# Patient Record
Sex: Male | Born: 1975
Health system: Southern US, Community
[De-identification: ages and names within clinical notes are randomized; demographics above are authoritative.]

## PROBLEM LIST (undated history)

## (undated) DIAGNOSIS — F32A Depression, unspecified: Secondary | ICD-10-CM

## (undated) DIAGNOSIS — F329 Major depressive disorder, single episode, unspecified: Secondary | ICD-10-CM

## (undated) DIAGNOSIS — F419 Anxiety disorder, unspecified: Secondary | ICD-10-CM

---
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
## 2001-07-08 ENCOUNTER — Encounter: Admission: RE | Admit: 2001-07-08 | Discharge: 2001-07-08 | Payer: Self-pay | Admitting: Family Medicine

## 2001-07-08 ENCOUNTER — Encounter: Payer: Self-pay | Admitting: Family Medicine

## 2004-11-26 ENCOUNTER — Ambulatory Visit (HOSPITAL_COMMUNITY): Admission: RE | Admit: 2004-11-26 | Discharge: 2004-11-26 | Payer: Self-pay | Admitting: Allergy

## 2005-07-21 ENCOUNTER — Encounter: Admission: RE | Admit: 2005-07-21 | Discharge: 2005-07-21 | Payer: Self-pay | Admitting: Family Medicine

## 2005-07-23 ENCOUNTER — Encounter: Admission: RE | Admit: 2005-07-23 | Discharge: 2005-07-23 | Payer: Self-pay | Admitting: Family Medicine

## 2005-08-13 ENCOUNTER — Ambulatory Visit (HOSPITAL_COMMUNITY): Admission: RE | Admit: 2005-08-13 | Discharge: 2005-08-13 | Payer: Self-pay | Admitting: Neurosurgery

## 2005-12-15 ENCOUNTER — Encounter: Admission: RE | Admit: 2005-12-15 | Discharge: 2005-12-15 | Payer: Self-pay | Admitting: Neurosurgery

## 2006-04-30 ENCOUNTER — Observation Stay (HOSPITAL_COMMUNITY): Admission: RE | Admit: 2006-04-30 | Discharge: 2006-04-30 | Payer: Self-pay | Admitting: Neurosurgery

## 2008-02-23 ENCOUNTER — Encounter: Admission: RE | Admit: 2008-02-23 | Discharge: 2008-02-23 | Payer: Self-pay | Admitting: Family Medicine

## 2008-07-31 ENCOUNTER — Encounter: Admission: RE | Admit: 2008-07-31 | Discharge: 2008-07-31 | Payer: Self-pay | Admitting: Neurosurgery

## 2008-11-08 ENCOUNTER — Ambulatory Visit (HOSPITAL_COMMUNITY): Admission: RE | Admit: 2008-11-08 | Discharge: 2008-11-08 | Payer: Self-pay | Admitting: Neurosurgery

## 2009-01-03 ENCOUNTER — Encounter: Admission: RE | Admit: 2009-01-03 | Discharge: 2009-01-03 | Payer: Self-pay | Admitting: Neurosurgery

## 2009-03-06 ENCOUNTER — Encounter: Admission: RE | Admit: 2009-03-06 | Discharge: 2009-03-06 | Payer: Self-pay | Admitting: Family Medicine

## 2009-07-10 ENCOUNTER — Encounter: Admission: RE | Admit: 2009-07-10 | Discharge: 2009-07-10 | Payer: Self-pay | Admitting: Neurosurgery

## 2010-10-24 ENCOUNTER — Ambulatory Visit (HOSPITAL_COMMUNITY)
Admission: RE | Admit: 2010-10-24 | Discharge: 2010-10-24 | Payer: Self-pay | Source: Home / Self Care | Attending: Neurosurgery | Admitting: Neurosurgery

## 2010-12-02 ENCOUNTER — Encounter
Admission: RE | Admit: 2010-12-02 | Discharge: 2010-12-02 | Payer: Self-pay | Source: Home / Self Care | Attending: Neurosurgery | Admitting: Neurosurgery

## 2010-12-05 ENCOUNTER — Inpatient Hospital Stay (HOSPITAL_COMMUNITY)
Admission: RE | Admit: 2010-12-05 | Discharge: 2010-12-08 | DRG: 030 | Disposition: A | Discharge status: Home / Self Care | Payer: 59 | Source: Ambulatory Visit | Attending: Neurosurgery | Admitting: Neurosurgery

## 2010-12-05 DIAGNOSIS — G988 Other disorders of nervous system: Principal | ICD-10-CM | POA: Diagnosis present

## 2010-12-05 DIAGNOSIS — F17200 Nicotine dependence, unspecified, uncomplicated: Secondary | ICD-10-CM | POA: Diagnosis present

## 2010-12-05 DIAGNOSIS — Y849 Medical procedure, unspecified as the cause of abnormal reaction of the patient, or of later complication, without mention of misadventure at the time of the procedure: Secondary | ICD-10-CM | POA: Diagnosis present

## 2010-12-05 DIAGNOSIS — K5900 Constipation, unspecified: Secondary | ICD-10-CM | POA: Diagnosis present

## 2010-12-05 LAB — SURGICAL PCR SCREEN
MRSA, PCR: NEGATIVE
Staphylococcus aureus: NEGATIVE

## 2010-12-05 LAB — CBC
Hemoglobin: 13.7 g/dL (ref 13.0–17.0)
Platelets: 291 10*3/uL (ref 150–400)
RBC: 4.35 MIL/uL (ref 4.22–5.81)
WBC: 11.4 10*3/uL — ABNORMAL HIGH (ref 4.0–10.5)

## 2010-12-14 NOTE — Op Note (Signed)
Peter Valdez, Peter Valdez               ACCOUNT NO.:  [id number]  MEDICAL RECORD NO.:  [id number]           PATIENT TYPE:  I  LOCATION:  3006                         FACILITY:  [facility]  PHYSICIAN:  Danae Orleans. Venetia Maxon, M.D.  DATE OF BIRTH:  1976-09-26  DATE OF PROCEDURE:  12/05/2010 DATE OF DISCHARGE:                              OPERATIVE REPORT   PREOPERATIVE DIAGNOSIS:  Lumbar fluid collection with leg pain.  POSTOPERATIVE DIAGNOSIS:  Lumbar fluid collection with leg pain.  PROCEDURE:  Exploration of lumbar wound with repair of CSF leak.  SURGEON:  Danae Orleans. Venetia Maxon, MD  ASSISTANT:  Hewitt Shorts, MD  ANESTHESIA:  General endotracheal anesthesia.  ESTIMATED BLOOD LOSS:  Minimal.  COMPLICATIONS:  None.  DISPOSITION:  Recovery.  INDICATIONS:  Peter Valdez is a 35 year old man who had a right L4-5 microdiskectomy.  He did well following surgery for a month.  He said he was getting into his truck and developed back pain and leg pain and since then has had intermittent headache.  He had some fluid behind his lumbar wound, which developed.  He was not complaining of significant headache.  It was elected to take him to surgery for exploration of lumbar wound, which was felt to be consistent with CSF leak.  PROCEDURE:  [name] was brought to the operating room.  Following a satisfactory and uncomplicated induction of general endotracheal anesthesia and placement of intravenous lines, the patient was placed in a prone position on the Wilson frame.  His low back was prepped and draped in usual sterile fashion.  Previous incision was reopened immediately there was some CSF confirming suspicion of CSF leak. Exposure of previous laminectomy defect was then performed.  The previous diskectomy site was inspected and residual disk material was removed.  There was no immediately apparent site of CSF leakage, but on exploration of the dura, it was found that at the site of  the foraminotomy overlying the L5 nerve root, a spicule of bone had punctured the dura and that a nerve root had herniated through this dural opening.  To repair this it was felt that we need to remove more bone, so a more thorough foraminotomy was performed overlying the L5 nerve root to decompress the dura.  The edges of dura were defined under microscopic visualization and using microdissection technique the dural opening was closed after returning the nerve roots to the thecal sac and it was closed with three 6-0 Prolene interrupted sutures.  There did not appear to be any residual leakage of CSF after this and Valsalva maneuver did not demonstrate any additional leakage of spinal fluid. DuraSeal was placed over the repaired dural defect.  The fascia was then closed with 0- Vicryl sutures, subcutaneous tissues were reapproximated with 2-0 Vicryl interrupted inverted sutures, and skin edges were approximated with 3-0 nylon running lock stitch.  Sterile occlusive dressing was placed.  A Foley catheter was placed at the conclusion of surgery. Danae Orleans. Venetia Maxon, M.D.     JDS/MEDQ  D:  12/05/2010  T:  12/06/2010  Job:  [id number]  Electronically Signed by Maeola Harman M.D. on 12/12/2010  10:19:43 AM

## 2011-01-12 LAB — SURGICAL PCR SCREEN
MRSA, PCR: NEGATIVE
Staphylococcus aureus: POSITIVE — AB

## 2011-01-12 LAB — CBC
Hemoglobin: 14 g/dL (ref 13.0–17.0)
Platelets: 278 10*3/uL (ref 150–400)
RBC: 4.34 MIL/uL (ref 4.22–5.81)
WBC: 11.2 10*3/uL — ABNORMAL HIGH (ref 4.0–10.5)

## 2011-02-16 LAB — CBC
HCT: 42.6 % (ref 39.0–52.0)
Hemoglobin: 14.8 g/dL (ref 13.0–17.0)
MCHC: 34.6 g/dL (ref 30.0–36.0)
MCV: 94.2 fL (ref 78.0–100.0)
RBC: 4.52 MIL/uL (ref 4.22–5.81)
WBC: 11.2 10*3/uL — ABNORMAL HIGH (ref 4.0–10.5)

## 2011-02-24 NOTE — Discharge Summary (Signed)
  [name], [name]               ACCOUNT NO.:  [id number]  MEDICAL RECORD NO.:  [id number]           PATIENT TYPE:  I  LOCATION:  3006                         FACILITY:  [facility]  PHYSICIAN:  Danae Orleans. Venetia Maxon, M.D.  DATE OF BIRTH:  16-Apr-1976  DATE OF ADMISSION:  12/05/2010 DATE OF DISCHARGE:  12/08/2010                              DISCHARGE SUMMARY   REASON FOR ADMISSION:  Lumbar fluid collection with leg pain.  HISTORY OF ILLNESS:  Peter Valdez is a 35 year old man who had undergone a right L4-L5 microdiskectomy.  He did well following surgery for 1 month, then he said he was getting into his truck, developed back pain and leg pain, and since then he has had intermittent headache.  He had some fluid behind his wound which had developed.  He was not complaining of significant headache.  It was elected to take him for surgery for exploration of his lumbar wound which was felt to be consistent with CSF leakage.  At the time of surgery, the patient was found to have in detail CSF leak that on exploration of the dura was found at the site of the foraminotomy overlying the L5 nerve root. A spicule of bone had puncture the dura and that the nerve root had herniated through this dural opening.  This was then repaired with 6-0 Prolene stitches and there did not appear to be any residual leakage of spinal fluid at this point.  The patient was then admitted to the hospital and was maintained on bedrest for 3 days postoperatively, was then mobilized, and discharged home with instructions to follow up in the office for suture removal 10 days postoperatively.  His preoperative medicines include: 1. Tizanidine 4 mg twice daily as needed. 2. Percocet 10/325 one every 6 hours as needed. 3. Zyrtec 10 mg daily. 4. Amitriptyline 75 mg daily at bedtime. 5. Ibuprofen 200 mg every 8 hours as needed.  DISCHARGE STATUS:  Good.  FINAL DIAGNOSIS:  Same as admission diagnosis.  DISCHARGE CONDITION:   Improved.  FOLLOWUP:  Follow up in 10 days for suture removal.     Danae Orleans. Venetia Maxon, M.D.     JDS/MEDQ  D:  02/18/2011  T:  02/18/2011  Job:  [id number]  Electronically Signed by Maeola Harman M.D. on 02/24/2011 07:37:06 AM

## 2011-03-17 NOTE — Op Note (Signed)
NAMEWYMAN, MESCHKE               ACCOUNT NO.:  [id number]   MEDICAL RECORD NO.:  [id number]          PATIENT TYPE:  OIB   LOCATION:  3524                         FACILITY:  [facility]   PHYSICIAN:  Danae Orleans. Venetia Maxon, M.D.  DATE OF BIRTH:  Mar 02, 1976   DATE OF PROCEDURE:  11/08/2008  DATE OF DISCHARGE:  11/08/2008                               OPERATIVE REPORT   PREOPERATIVE DIAGNOSIS:  Left ulnar neuropathy.   POSTOPERATIVE DIAGNOSIS:  Left ulnar neuropathy.   PROCEDURE:  Left ulnar nerve decompression.   SURGEON:  Danae Orleans. Venetia Maxon, MD   ANESTHESIA:  General laryngeal mask anesthesia.   ESTIMATED BLOOD LOSS:  Minimal.   COMPLICATIONS:  None.   DISPOSITION:  Recovery.   INDICATIONS:  Peter Valdez is a 35 year old man with left ulnar nerve  neuropathy with weakness in his left hand documented by EMG nerve  conduction velocities, elected to perform a left ulnar nerve  decompression.   PROCEDURE:  [name] was brought to the operating room.  Following  satisfactory uncomplicated induction of general anesthesia with  laryngeal mask anesthesia, the patient was placed in supine position on  the operating table.  His left arm was then prepped and draped with  Betadine scrub, paint, and sterile stockinette.  His left medial  epicondyle was marked, an incision was made based on the medial  epicondyle slightly volar to that and infiltrated local lidocaine made  with a 15 blade, carried through subcutaneous tissues to the fascia,  which was incised sharply.  The ulnar nerve was identified as it coursed  around the medial epicondyle.  It was decompressed as it extended  between the two heads of the flexor carpi ulnaris.  There was a band of  restrictive tissue just proximal to the epicondyle.  This was incised  with decompression of the nerve.  The intermuscular septum was also  incised and the nerve was felt to be well decompressed as it coursed  around the elbow.  Flexing the arm did  not cause the nerve to sublux.  The wound was then irrigated.  Hemostasis was assured, 2-0 Vicryl  sutures were used to reapproximate the subcutaneous tissues.  The skin  edges were reapproximated with 3-0 Vicryl and subcuticular interrupted  inverted  sutures.  The wound was dressed with Benzoin, Steri-Strips, Telfa gauze,  gauze fluff, Kerlix wrap, Kling wrap.  The patient was extubated in the  operating room and taken to recovery in stable satisfactory condition,  having the tolerated the operation well.  Counts were correct at the end  of the case.      Danae Orleans. Venetia Maxon, M.D.  Electronically Signed     JDS/MEDQ  D:  11/08/2008  T:  11/08/2008  Job:  [id number]

## 2011-03-20 NOTE — Op Note (Signed)
NAMEKENNON, [name] NO.:  [id number]   MEDICAL RECORD NO.:  [id number]          PATIENT TYPE:  INP   LOCATION:  2899                         FACILITY:  [facility]   PHYSICIAN:  Danae Orleans. Venetia Maxon, M.D.  DATE OF BIRTH:  [birth year]   DATE OF PROCEDURE:  04/30/2006  DATE OF DISCHARGE:                                 OPERATIVE REPORT   PREOPERATIVE DIAGNOSIS:  Pseudoarthrosis, C6-7, status post anterior  cervical decompression and fusion at C6-7 level with neck pain in a heavy  smoker.   POSTOPERATIVE DIAGNOSIS:  Pseudoarthrosis, C6-7, status post anterior  cervical decompression and fusion at C6-7 level with neck pain in a heavy  smoker.   PROCEDURE:  1.  Posterior cervical fusion with lateral mass screws, C6-7 level with      morselized allograft (Osteocel).  2.  Posterolateral arthrodesis.   SURGEON:  Danae Orleans. Venetia Maxon, M.D.   ANESTHESIA:  General endotracheal anesthesia.   ESTIMATED BLOOD LOSS:  Minimal.   COMPLICATIONS:  None.   DISPOSITION:  To recovery.   INDICATIONS:  Peter Valdez is a 35 year old man who previously underwent  anterior cervical decompression and fusion at the C6-7 level for a herniated  cervical disk with cervical radiculopathy.  Postoperatively, he continued to  smoke heavily.  He had persistent neck pain, which actually worsened over  time and he had a flexion and extension radiographs, which demonstrated some  posterior widening of interspinous distance on flexion compared to  extension.  While this was not marked movement, there did not appear to be  any other significant abnormalities on the cervical MRI.  Consequently, it  was elected to take him to surgery for posterior fixation of what was  presumed to be a pseudoarthrosis.   PROCEDURE:  Peter Valdez was brought to the operating room.  He was placed in  a 3-pin head fixation, turned prone on the operating table on chest rolls.  His neck was maintained in neutral alignment.   Preoperative x-ray was  obtained, which demonstrated that his cervical spine was well aligned and a  marker needle was taped to his neck, which demonstrated the needle at the C5  level.  Subsequently, his posterior neck was then prepped and draped in the  usual sterile fashion.  Skin and subcutaneous tissues were infiltrated with  0.25% Marcaine, 0.5% lidocaine with 1:200,000 epinephrine.  Incision was  made overlying the C6 and C7 spinous processes and carried sharply through  posterior cervical fascia, and then subperiosteal dissection was performed  excluding the C6 and C7 lateral masses bilaterally.  A self-retaining  retractor was placed.  Intraoperative x-ray was obtained with towel clips on  the C6 and C7 spinous processes.  This was confirmed to be the correct level  on intraoperative x-ray.  Subsequently, after confirming the correct level,  the towel clips were manipulated, and there was clear evidence of a non-  union at this level with movement of the facette joint complex at C6 and C7.  Subsequently, the lateral mass fixation was placed using 10 mm screws at C6  and C7.  All  screws have excellent purchase, and they were placed in  standard fashion.  No cut-outs of the bone screws.  Subsequently, the  lateral mass and facette joint complex was vigorously decorticated, both  with curettes and then a high-speed drill and 5 cc of Osteocel had been  thawed and then was tamped into position, both in the facette joints and  also overlying the lamina and lateral masses bilaterally.  Rods, 2.5 cm,  were cut and placed over the screws, and these were torqued and locked down  in situ.  The self-retaining retractor was removed.  The poster cervical  fascia was then re-approximated with #0 Vicryl sutures.  The subcutaneous  tissues were reapproximated with 2-0 Vicryl interrupted sutures.  The skin  edges were re-approximated with interrupted 3-0 Vicryl subcuticular stitch.  The wound was  dressed with Dermabond and then a sterile occlusive dressing  was placed.  The patient was placed in the Aspen collar.  He was turned to a  supine position on the OR gurney and 3-pin head fixation was removed.  He  was extubated in the operating room and taken to the recovery room in stable  and satisfactory condition having tolerated his operation well.  The counts  were correct at the end of the case.      Danae Orleans. Venetia Maxon, M.D.  Electronically Signed     JDS/MEDQ  D:  04/30/2006  T:  04/30/2006  Job:  [id number]

## 2011-03-20 NOTE — Op Note (Signed)
[name], [name]               ACCOUNT NO.:  [id number]   MEDICAL RECORD NO.:  [id number]          PATIENT TYPE:  AMB   LOCATION:  SDS                          FACILITY:  [facility]   PHYSICIAN:  Peter Valdez, M.D.  DATE OF BIRTH:  December 03, 1975   DATE OF PROCEDURE:  08/13/2005  DATE OF DISCHARGE:                                 OPERATIVE REPORT   PREOPERATIVE DIAGNOSIS:  Herniated cervical disk with spondylosis,  degenerative disk disease and radiculopathy, C6-7 level.   POSTOPERATIVE DIAGNOSIS:  Herniated cervical disk with spondylosis,  degenerative disk disease and radiculopathy, C6-7 level.   OPERATION PERFORMED:  Anterior cervical decompression and fusion C6-7 with  PEEK interbody cage, morcellized bone autograft, and anterior cervical  plate.   SURGEON:  Peter Valdez, M.D.   ASSISTANT:  Peter Valdez, M.D.   ANESTHESIA:  General endotracheal.   ESTIMATED BLOOD LOSS:  Minimal.   COMPLICATIONS:  None.   DISPOSITION:  Recovery.   INDICATIONS FOR PROCEDURE:  Peter Valdez is a 35 year old young man with a  herniated disk at C6-7 with right C7 radiculopathy.  It was elected to take  him to surgery for anterior cervical decompression and fusion at the C6-7  level.   DESCRIPTION OF PROCEDURE:  Peter Valdez was brought to the operating room.  Following satisfactory and uncomplicated induction of general endotracheal  anesthesia and placement of intravenous lines, the patient was placed in  supine position on the operating table.  The neck was placed in slight  extension and he was placed in 10 pounds of halter traction.  The anterior  neck was then prepped and draped in the usual sterile fashion.  The area of  planned incision was infiltrated with 0.25% Marcaine and 0.5% lidocaine  1:200,000 epinephrine.  Incision was made from the midline to the anterior  border of the sternocleidomastoid muscle, carried sharply through the  platysma layer.  This was placed in one of  the lower neck creases overlying  the left carotid tubercle.  Platysma layer was incised.  Subplatysmal  dissection was performed exposing the anterior border of the  sternocleidomastoid muscle. Using blunt dissection, the carotid sheath was  kept lateral, the trachea and esophagus kept medial exposing the anterior  cervical spine.  A bent spinal needle was placed at what was felt to be the  C6-7 level.  This was confirmed on intraoperative x-ray.  Subsequently, the  longus colli muscles were taken down from the anterior cervical spine from  C6 through C7 using electrocautery and Key elevator.  Self-retaining  Shadowline retractor was placed to facilitate exposure.  The C6-7 interspace  was then incised with a 15 blade and disk material was removed in piecemeal  fashion.  End plates were stripped of residual disk material using a variety  of Carlens curettes.  Pituitary rongeurs were used to remove disk material.  There was a large fragment of herniated disk material the tail of which was  coming back through the posterior longitudinal ligament and this was removed  resulting in significant decompression of the right neural foramen.  Under  microscopic  visualization the end plates of C6 and C7 were decorticated  using high speed drill.  Bone drillings were then retained for later use as  bone grafting.  There were fairly large uncinate spurs which were removed on  the right at the C6-7 level with resultant significant decompression of the  right C7 nerve root.  Similar decompression was performed on the right side  of the midline.  Spinal cord dura and C7 nerve roots were decompressed  bilaterally.  Hemostasis was assured with Gelfoam soaked in Thrombin.  After  trial sizing an 8 mm PEEK interbody cage was selected, packed with  morcellized bone autograft, inserted in the interspace and countersunk  appropriately.  After halter traction was removed, a 14 mm anterior cervical  plate was then  affixed to the anterior cervical spine using 14 mm x 4 mm  variable angle screws.  All screws had excellent purchase.  Locking  mechanism was then engaged.  Final X-ray demonstrated well positioned  interbody graft and anterior cervical plate.  The wound was then copiously  irrigated with bacitracin saline.  Soft tissues were inspected and found to  be in good repair.  The platysma layer was closed with 3-0 Vicryl sutures  and skin edges were reapproximated with interrupted 3-0 Vicryl subcuticular  stitch.  The wound was dressed with Dermabond.  The patient was extubated in  the operating room and taken to the recovery room in stable and satisfactory  condition having tolerated the operation well.  Counts were correct at the  end of the case.      Peter Valdez, M.D.  Electronically Signed     JDS/MEDQ  D:  08/13/2005  T:  08/13/2005  Job:  [id number]

## 2014-04-02 ENCOUNTER — Other Ambulatory Visit: Payer: Self-pay | Admitting: Physical Medicine and Rehabilitation

## 2014-04-02 DIAGNOSIS — M5412 Radiculopathy, cervical region: Secondary | ICD-10-CM

## 2014-04-08 ENCOUNTER — Inpatient Hospital Stay: Admission: RE | Admit: 2014-04-08 | Payer: 59 | Source: Ambulatory Visit

## 2014-04-10 ENCOUNTER — Inpatient Hospital Stay: Admission: RE | Admit: 2014-04-10 | Payer: 59 | Source: Ambulatory Visit

## 2014-04-17 ENCOUNTER — Ambulatory Visit
Admission: RE | Admit: 2014-04-17 | Discharge: 2014-04-17 | Disposition: A | Discharge status: Home / Self Care | Payer: 59 | Source: Ambulatory Visit | Attending: Physical Medicine and Rehabilitation | Admitting: Physical Medicine and Rehabilitation

## 2014-04-17 ENCOUNTER — Other Ambulatory Visit: Payer: Self-pay | Admitting: Occupational Medicine

## 2014-04-17 ENCOUNTER — Ambulatory Visit
Admission: RE | Admit: 2014-04-17 | Discharge: 2014-04-17 | Disposition: A | Discharge status: Home / Self Care | Payer: 59 | Source: Ambulatory Visit | Attending: Occupational Medicine | Admitting: Occupational Medicine

## 2014-04-17 DIAGNOSIS — M79641 Pain in right hand: Secondary | ICD-10-CM

## 2014-04-17 DIAGNOSIS — M5412 Radiculopathy, cervical region: Secondary | ICD-10-CM

## 2014-04-17 MED ORDER — GADOBENATE DIMEGLUMINE 529 MG/ML IV SOLN
16.0000 mL | Freq: Once | INTRAVENOUS | Status: AC | PRN
  Administered 2014-04-17: 16 mL via INTRAVENOUS

## 2014-10-02 ENCOUNTER — Ambulatory Visit
Admission: RE | Admit: 2014-10-02 | Discharge: 2014-10-02 | Disposition: A | Discharge status: Home / Self Care | Payer: 59 | Source: Ambulatory Visit | Attending: Anesthesiology | Admitting: Anesthesiology

## 2014-10-02 ENCOUNTER — Other Ambulatory Visit: Payer: Self-pay | Admitting: Anesthesiology

## 2014-10-02 DIAGNOSIS — M25511 Pain in right shoulder: Secondary | ICD-10-CM

## 2015-02-07 ENCOUNTER — Other Ambulatory Visit: Payer: Self-pay | Admitting: Physical Medicine and Rehabilitation

## 2015-02-07 DIAGNOSIS — M5417 Radiculopathy, lumbosacral region: Secondary | ICD-10-CM

## 2015-02-12 ENCOUNTER — Ambulatory Visit
Admission: RE | Admit: 2015-02-12 | Discharge: 2015-02-12 | Disposition: A | Discharge status: Home / Self Care | Payer: 59 | Source: Ambulatory Visit | Attending: Physical Medicine and Rehabilitation | Admitting: Physical Medicine and Rehabilitation

## 2015-02-12 DIAGNOSIS — M5417 Radiculopathy, lumbosacral region: Secondary | ICD-10-CM

## 2015-06-03 ENCOUNTER — Ambulatory Visit (HOSPITAL_BASED_OUTPATIENT_CLINIC_OR_DEPARTMENT_OTHER): Payer: 59 | Attending: Physical Medicine and Rehabilitation | Admitting: Radiology

## 2015-06-03 VITALS — Ht 69.0 in | Wt 170.0 lb

## 2015-06-03 DIAGNOSIS — G4733 Obstructive sleep apnea (adult) (pediatric): Secondary | ICD-10-CM | POA: Insufficient documentation

## 2015-06-03 DIAGNOSIS — G4710 Hypersomnia, unspecified: Secondary | ICD-10-CM | POA: Insufficient documentation

## 2015-06-03 DIAGNOSIS — R5383 Other fatigue: Secondary | ICD-10-CM

## 2015-06-03 DIAGNOSIS — F329 Major depressive disorder, single episode, unspecified: Secondary | ICD-10-CM

## 2015-06-03 DIAGNOSIS — G4719 Other hypersomnia: Secondary | ICD-10-CM

## 2015-06-03 DIAGNOSIS — R0683 Snoring: Secondary | ICD-10-CM | POA: Diagnosis not present

## 2015-06-03 DIAGNOSIS — F32A Depression, unspecified: Secondary | ICD-10-CM

## 2015-06-29 DIAGNOSIS — R5383 Other fatigue: Secondary | ICD-10-CM | POA: Diagnosis not present

## 2015-06-29 DIAGNOSIS — F329 Major depressive disorder, single episode, unspecified: Secondary | ICD-10-CM | POA: Diagnosis not present

## 2015-06-29 DIAGNOSIS — G4719 Other hypersomnia: Secondary | ICD-10-CM | POA: Diagnosis not present

## 2015-06-29 NOTE — Progress Notes (Signed)
   Patient Name: Peter Valdez, Peter Valdez Date: 06/03/2015 Gender: Male D.O.B: 02-Jul-1976 Age (years): 38 Referring Provider: Not Available Height (inches): 69 Interpreting Physician: Jetty Duhamel MD, ABSM Weight (lbs): 170 RPSGT: [place] BMI: 25 MRN: [id number] Neck Size: 14.00 CLINICAL INFORMATION Sleep Study Type: Unattended Home Sleep Test  Indication for sleep study: 780.54 Hypersomnia, OSA     Epworth Sleepiness Score: 5/24  SLEEP STUDY TECHNIQUE A multi-channel overnight portable sleep study was performed. The channels recorded were: nasal airflow, thoracic respiratory movement, and oxygen saturation with a pulse oximetry. Snoring was also monitored.  MEDICATIONS Patient self administered medications include: N/A.  SLEEP ARCHITECTURE Patient was studied for 426.0 minutes. The sleep efficiency was 89.0 % and the patient was supine for 13.4%. The arousal index was 0.0 per hour.  RESPIRATORY PARAMETERS The overall AHI was 0.6 per hour, with a central apnea index of 0.1 per hour. The oxygen nadir was 85% during sleep. Snoring was noted  CARDIAC DATA Mean heart rate during sleep was 67.4 bpm.  IMPRESSIONS No significant obstructive sleep apnea occurred during this study (AHI = 0.6/h). No significant central sleep apnea occurred during this study (CAI = 0.1/h). Moderate oxygen desaturation was noted during this study (Min O2 = 85%). Mean O2 saturation on room air 93% Patient snored 0.1% during the sleep.  DIAGNOSIS Normal study  RECOMMENDATIONS Avoid alcohol, sedatives and other CNS depressants that may worsen sleep apnea and disrupt normal sleep architecture. Sleep hygiene should be reviewed to assess factors that may improve sleep quality. Weight management and regular exercise should be initiated or continued.    ELECTRONICALLY SIGNED ON:  06/29/2015, 8:29 AM [facility] PH: [phone]   FX: [phone] ACCREDITED BY  THE [employer]

## 2015-12-06 ENCOUNTER — Other Ambulatory Visit: Payer: Self-pay | Admitting: Family Medicine

## 2015-12-06 DIAGNOSIS — R634 Abnormal weight loss: Secondary | ICD-10-CM

## 2015-12-10 ENCOUNTER — Other Ambulatory Visit: Payer: 59

## 2015-12-17 ENCOUNTER — Ambulatory Visit
Admission: RE | Admit: 2015-12-17 | Discharge: 2015-12-17 | Disposition: A | Discharge status: Home / Self Care | Payer: 59 | Source: Ambulatory Visit | Attending: Family Medicine | Admitting: Family Medicine

## 2015-12-17 DIAGNOSIS — R634 Abnormal weight loss: Secondary | ICD-10-CM

## 2016-03-02 ENCOUNTER — Other Ambulatory Visit (HOSPITAL_COMMUNITY): Payer: Self-pay | Admitting: General Surgery

## 2016-03-02 DIAGNOSIS — R109 Unspecified abdominal pain: Secondary | ICD-10-CM

## 2016-03-02 DIAGNOSIS — R110 Nausea: Secondary | ICD-10-CM

## 2016-03-13 ENCOUNTER — Ambulatory Visit (HOSPITAL_COMMUNITY)
Admission: RE | Admit: 2016-03-13 | Discharge: 2016-03-13 | Disposition: A | Discharge status: Home / Self Care | Payer: 59 | Source: Ambulatory Visit | Attending: General Surgery | Admitting: General Surgery

## 2016-03-13 DIAGNOSIS — R110 Nausea: Secondary | ICD-10-CM

## 2016-03-13 DIAGNOSIS — R109 Unspecified abdominal pain: Secondary | ICD-10-CM

## 2016-03-16 ENCOUNTER — Ambulatory Visit (HOSPITAL_COMMUNITY): Admission: RE | Admit: 2016-03-16 | Payer: 59 | Source: Ambulatory Visit

## 2016-03-19 ENCOUNTER — Other Ambulatory Visit: Payer: Self-pay | Admitting: Physical Medicine and Rehabilitation

## 2016-03-19 ENCOUNTER — Ambulatory Visit
Admission: RE | Admit: 2016-03-19 | Discharge: 2016-03-19 | Disposition: A | Discharge status: Home / Self Care | Payer: 59 | Source: Ambulatory Visit | Attending: Physical Medicine and Rehabilitation | Admitting: Physical Medicine and Rehabilitation

## 2016-03-19 DIAGNOSIS — M25562 Pain in left knee: Principal | ICD-10-CM

## 2016-03-19 DIAGNOSIS — M25561 Pain in right knee: Secondary | ICD-10-CM

## 2016-11-19 DIAGNOSIS — M47817 Spondylosis without myelopathy or radiculopathy, lumbosacral region: Secondary | ICD-10-CM | POA: Diagnosis not present

## 2016-11-19 DIAGNOSIS — G894 Chronic pain syndrome: Secondary | ICD-10-CM | POA: Diagnosis not present

## 2016-11-19 DIAGNOSIS — M47812 Spondylosis without myelopathy or radiculopathy, cervical region: Secondary | ICD-10-CM | POA: Diagnosis not present

## 2016-12-09 DIAGNOSIS — Z0000 Encounter for general adult medical examination without abnormal findings: Secondary | ICD-10-CM | POA: Diagnosis not present

## 2016-12-09 DIAGNOSIS — E7800 Pure hypercholesterolemia, unspecified: Secondary | ICD-10-CM | POA: Diagnosis not present

## 2016-12-09 DIAGNOSIS — Z23 Encounter for immunization: Secondary | ICD-10-CM | POA: Diagnosis not present

## 2016-12-16 DIAGNOSIS — M47812 Spondylosis without myelopathy or radiculopathy, cervical region: Secondary | ICD-10-CM | POA: Diagnosis not present

## 2016-12-16 DIAGNOSIS — M47817 Spondylosis without myelopathy or radiculopathy, lumbosacral region: Secondary | ICD-10-CM | POA: Diagnosis not present

## 2016-12-16 DIAGNOSIS — G894 Chronic pain syndrome: Secondary | ICD-10-CM | POA: Diagnosis not present

## 2017-01-13 DIAGNOSIS — G894 Chronic pain syndrome: Secondary | ICD-10-CM | POA: Diagnosis not present

## 2017-01-13 DIAGNOSIS — M47812 Spondylosis without myelopathy or radiculopathy, cervical region: Secondary | ICD-10-CM | POA: Diagnosis not present

## 2017-01-13 DIAGNOSIS — M47817 Spondylosis without myelopathy or radiculopathy, lumbosacral region: Secondary | ICD-10-CM | POA: Diagnosis not present

## 2017-02-10 DIAGNOSIS — M47817 Spondylosis without myelopathy or radiculopathy, lumbosacral region: Secondary | ICD-10-CM | POA: Diagnosis not present

## 2017-02-10 DIAGNOSIS — M47812 Spondylosis without myelopathy or radiculopathy, cervical region: Secondary | ICD-10-CM | POA: Diagnosis not present

## 2017-02-10 DIAGNOSIS — G894 Chronic pain syndrome: Secondary | ICD-10-CM | POA: Diagnosis not present

## 2017-03-10 DIAGNOSIS — Z79891 Long term (current) use of opiate analgesic: Secondary | ICD-10-CM | POA: Diagnosis not present

## 2017-03-10 DIAGNOSIS — M47812 Spondylosis without myelopathy or radiculopathy, cervical region: Secondary | ICD-10-CM | POA: Diagnosis not present

## 2017-03-10 DIAGNOSIS — G894 Chronic pain syndrome: Secondary | ICD-10-CM | POA: Diagnosis not present

## 2017-03-10 DIAGNOSIS — M47817 Spondylosis without myelopathy or radiculopathy, lumbosacral region: Secondary | ICD-10-CM | POA: Diagnosis not present

## 2017-03-16 ENCOUNTER — Other Ambulatory Visit: Payer: Self-pay | Admitting: Family Medicine

## 2017-03-16 DIAGNOSIS — R200 Anesthesia of skin: Secondary | ICD-10-CM | POA: Diagnosis not present

## 2017-03-16 DIAGNOSIS — J329 Chronic sinusitis, unspecified: Secondary | ICD-10-CM | POA: Diagnosis not present

## 2017-03-18 ENCOUNTER — Ambulatory Visit
Admission: RE | Admit: 2017-03-18 | Discharge: 2017-03-18 | Disposition: A | Discharge status: Home / Self Care | Payer: Commercial Managed Care - HMO | Source: Ambulatory Visit | Attending: Family Medicine | Admitting: Family Medicine

## 2017-03-18 DIAGNOSIS — M47816 Spondylosis without myelopathy or radiculopathy, lumbar region: Secondary | ICD-10-CM | POA: Diagnosis not present

## 2017-03-18 DIAGNOSIS — R200 Anesthesia of skin: Secondary | ICD-10-CM

## 2017-04-09 DIAGNOSIS — M47812 Spondylosis without myelopathy or radiculopathy, cervical region: Secondary | ICD-10-CM | POA: Diagnosis not present

## 2017-04-09 DIAGNOSIS — G894 Chronic pain syndrome: Secondary | ICD-10-CM | POA: Diagnosis not present

## 2017-04-09 DIAGNOSIS — M47817 Spondylosis without myelopathy or radiculopathy, lumbosacral region: Secondary | ICD-10-CM | POA: Diagnosis not present

## 2017-04-29 DIAGNOSIS — J343 Hypertrophy of nasal turbinates: Secondary | ICD-10-CM | POA: Diagnosis not present

## 2017-04-29 DIAGNOSIS — J342 Deviated nasal septum: Secondary | ICD-10-CM | POA: Diagnosis not present

## 2017-05-24 DIAGNOSIS — J342 Deviated nasal septum: Secondary | ICD-10-CM | POA: Diagnosis not present

## 2017-05-24 DIAGNOSIS — J343 Hypertrophy of nasal turbinates: Secondary | ICD-10-CM | POA: Diagnosis not present

## 2017-06-01 DIAGNOSIS — R350 Frequency of micturition: Secondary | ICD-10-CM | POA: Diagnosis not present

## 2017-06-01 DIAGNOSIS — R3913 Splitting of urinary stream: Secondary | ICD-10-CM | POA: Diagnosis not present

## 2017-06-07 DIAGNOSIS — G894 Chronic pain syndrome: Secondary | ICD-10-CM | POA: Diagnosis not present

## 2017-06-07 DIAGNOSIS — M47817 Spondylosis without myelopathy or radiculopathy, lumbosacral region: Secondary | ICD-10-CM | POA: Diagnosis not present

## 2017-06-07 DIAGNOSIS — M47812 Spondylosis without myelopathy or radiculopathy, cervical region: Secondary | ICD-10-CM | POA: Diagnosis not present

## 2017-07-06 DIAGNOSIS — G894 Chronic pain syndrome: Secondary | ICD-10-CM | POA: Diagnosis not present

## 2017-07-06 DIAGNOSIS — M47817 Spondylosis without myelopathy or radiculopathy, lumbosacral region: Secondary | ICD-10-CM | POA: Diagnosis not present

## 2017-07-06 DIAGNOSIS — M47812 Spondylosis without myelopathy or radiculopathy, cervical region: Secondary | ICD-10-CM | POA: Diagnosis not present

## 2017-07-12 ENCOUNTER — Other Ambulatory Visit: Payer: Self-pay | Admitting: Physical Medicine and Rehabilitation

## 2017-07-12 DIAGNOSIS — M5412 Radiculopathy, cervical region: Secondary | ICD-10-CM

## 2017-07-22 ENCOUNTER — Ambulatory Visit
Admission: RE | Admit: 2017-07-22 | Discharge: 2017-07-22 | Disposition: A | Discharge status: Home / Self Care | Payer: Commercial Managed Care - HMO | Source: Ambulatory Visit | Attending: Physical Medicine and Rehabilitation | Admitting: Physical Medicine and Rehabilitation

## 2017-07-22 DIAGNOSIS — M5412 Radiculopathy, cervical region: Secondary | ICD-10-CM

## 2017-07-22 DIAGNOSIS — R0682 Tachypnea, not elsewhere classified: Secondary | ICD-10-CM | POA: Diagnosis not present

## 2017-07-27 DIAGNOSIS — E7800 Pure hypercholesterolemia, unspecified: Secondary | ICD-10-CM | POA: Diagnosis not present

## 2017-07-27 DIAGNOSIS — E291 Testicular hypofunction: Secondary | ICD-10-CM | POA: Diagnosis not present

## 2017-07-27 DIAGNOSIS — Z23 Encounter for immunization: Secondary | ICD-10-CM | POA: Diagnosis not present

## 2017-08-03 DIAGNOSIS — G894 Chronic pain syndrome: Secondary | ICD-10-CM | POA: Diagnosis not present

## 2017-08-03 DIAGNOSIS — M47817 Spondylosis without myelopathy or radiculopathy, lumbosacral region: Secondary | ICD-10-CM | POA: Diagnosis not present

## 2017-08-03 DIAGNOSIS — M47812 Spondylosis without myelopathy or radiculopathy, cervical region: Secondary | ICD-10-CM | POA: Diagnosis not present

## 2017-09-02 DIAGNOSIS — G894 Chronic pain syndrome: Secondary | ICD-10-CM | POA: Diagnosis not present

## 2017-09-02 DIAGNOSIS — M47812 Spondylosis without myelopathy or radiculopathy, cervical region: Secondary | ICD-10-CM | POA: Diagnosis not present

## 2017-09-02 DIAGNOSIS — M47817 Spondylosis without myelopathy or radiculopathy, lumbosacral region: Secondary | ICD-10-CM | POA: Diagnosis not present

## 2017-09-02 DIAGNOSIS — Z79891 Long term (current) use of opiate analgesic: Secondary | ICD-10-CM | POA: Diagnosis not present

## 2017-09-29 DIAGNOSIS — M5412 Radiculopathy, cervical region: Secondary | ICD-10-CM | POA: Diagnosis not present

## 2017-09-29 DIAGNOSIS — M542 Cervicalgia: Secondary | ICD-10-CM | POA: Diagnosis not present

## 2017-09-29 DIAGNOSIS — M5022 Other cervical disc displacement, mid-cervical region, unspecified level: Secondary | ICD-10-CM | POA: Diagnosis not present

## 2017-09-30 DIAGNOSIS — M47812 Spondylosis without myelopathy or radiculopathy, cervical region: Secondary | ICD-10-CM | POA: Diagnosis not present

## 2017-09-30 DIAGNOSIS — M47817 Spondylosis without myelopathy or radiculopathy, lumbosacral region: Secondary | ICD-10-CM | POA: Diagnosis not present

## 2017-09-30 DIAGNOSIS — G894 Chronic pain syndrome: Secondary | ICD-10-CM | POA: Diagnosis not present

## 2017-10-19 DIAGNOSIS — M5417 Radiculopathy, lumbosacral region: Secondary | ICD-10-CM | POA: Diagnosis not present

## 2017-10-28 DIAGNOSIS — G4700 Insomnia, unspecified: Secondary | ICD-10-CM | POA: Diagnosis not present

## 2017-10-28 DIAGNOSIS — G894 Chronic pain syndrome: Secondary | ICD-10-CM | POA: Diagnosis not present

## 2017-10-28 DIAGNOSIS — M47817 Spondylosis without myelopathy or radiculopathy, lumbosacral region: Secondary | ICD-10-CM | POA: Diagnosis not present

## 2017-11-04 DIAGNOSIS — M50122 Cervical disc disorder at C5-C6 level with radiculopathy: Secondary | ICD-10-CM | POA: Diagnosis not present

## 2017-11-04 DIAGNOSIS — M4802 Spinal stenosis, cervical region: Secondary | ICD-10-CM | POA: Diagnosis not present

## 2017-11-04 DIAGNOSIS — M50222 Other cervical disc displacement at C5-C6 level: Secondary | ICD-10-CM | POA: Diagnosis not present

## 2017-11-29 DIAGNOSIS — G4700 Insomnia, unspecified: Secondary | ICD-10-CM | POA: Diagnosis not present

## 2017-11-29 DIAGNOSIS — G894 Chronic pain syndrome: Secondary | ICD-10-CM | POA: Diagnosis not present

## 2017-11-29 DIAGNOSIS — M47817 Spondylosis without myelopathy or radiculopathy, lumbosacral region: Secondary | ICD-10-CM | POA: Diagnosis not present

## 2017-12-06 DIAGNOSIS — M5412 Radiculopathy, cervical region: Secondary | ICD-10-CM | POA: Diagnosis not present

## 2017-12-27 DIAGNOSIS — Z0000 Encounter for general adult medical examination without abnormal findings: Secondary | ICD-10-CM | POA: Diagnosis not present

## 2017-12-27 DIAGNOSIS — E7800 Pure hypercholesterolemia, unspecified: Secondary | ICD-10-CM | POA: Diagnosis not present

## 2017-12-30 DIAGNOSIS — G894 Chronic pain syndrome: Secondary | ICD-10-CM | POA: Diagnosis not present

## 2017-12-30 DIAGNOSIS — M47817 Spondylosis without myelopathy or radiculopathy, lumbosacral region: Secondary | ICD-10-CM | POA: Diagnosis not present

## 2017-12-30 DIAGNOSIS — M48061 Spinal stenosis, lumbar region without neurogenic claudication: Secondary | ICD-10-CM | POA: Diagnosis not present

## 2018-01-17 DIAGNOSIS — M50222 Other cervical disc displacement at C5-C6 level: Secondary | ICD-10-CM | POA: Diagnosis not present

## 2018-01-27 DIAGNOSIS — M48062 Spinal stenosis, lumbar region with neurogenic claudication: Secondary | ICD-10-CM | POA: Diagnosis not present

## 2018-01-27 DIAGNOSIS — M47817 Spondylosis without myelopathy or radiculopathy, lumbosacral region: Secondary | ICD-10-CM | POA: Diagnosis not present

## 2018-01-27 DIAGNOSIS — G894 Chronic pain syndrome: Secondary | ICD-10-CM | POA: Diagnosis not present

## 2018-02-07 DIAGNOSIS — M25561 Pain in right knee: Secondary | ICD-10-CM | POA: Diagnosis not present

## 2018-02-07 DIAGNOSIS — M546 Pain in thoracic spine: Secondary | ICD-10-CM | POA: Diagnosis not present

## 2018-02-07 DIAGNOSIS — M545 Low back pain: Secondary | ICD-10-CM | POA: Diagnosis not present

## 2018-02-09 DIAGNOSIS — M546 Pain in thoracic spine: Secondary | ICD-10-CM | POA: Diagnosis not present

## 2018-02-09 DIAGNOSIS — M545 Low back pain: Secondary | ICD-10-CM | POA: Diagnosis not present

## 2018-02-09 DIAGNOSIS — M25561 Pain in right knee: Secondary | ICD-10-CM | POA: Diagnosis not present

## 2018-02-14 DIAGNOSIS — M25561 Pain in right knee: Secondary | ICD-10-CM | POA: Diagnosis not present

## 2018-02-14 DIAGNOSIS — M545 Low back pain: Secondary | ICD-10-CM | POA: Diagnosis not present

## 2018-02-14 DIAGNOSIS — M546 Pain in thoracic spine: Secondary | ICD-10-CM | POA: Diagnosis not present

## 2018-02-21 DIAGNOSIS — M25541 Pain in joints of right hand: Secondary | ICD-10-CM | POA: Diagnosis not present

## 2018-02-24 DIAGNOSIS — M47817 Spondylosis without myelopathy or radiculopathy, lumbosacral region: Secondary | ICD-10-CM | POA: Diagnosis not present

## 2018-02-24 DIAGNOSIS — M170 Bilateral primary osteoarthritis of knee: Secondary | ICD-10-CM | POA: Diagnosis not present

## 2018-02-24 DIAGNOSIS — G894 Chronic pain syndrome: Secondary | ICD-10-CM | POA: Diagnosis not present

## 2018-03-03 DIAGNOSIS — M25541 Pain in joints of right hand: Secondary | ICD-10-CM | POA: Diagnosis not present

## 2018-03-07 ENCOUNTER — Other Ambulatory Visit: Payer: Self-pay | Admitting: Sports Medicine

## 2018-03-07 DIAGNOSIS — S62306A Unspecified fracture of fifth metacarpal bone, right hand, initial encounter for closed fracture: Secondary | ICD-10-CM | POA: Diagnosis not present

## 2018-03-07 DIAGNOSIS — M79644 Pain in right finger(s): Secondary | ICD-10-CM

## 2018-03-09 ENCOUNTER — Ambulatory Visit
Admission: RE | Admit: 2018-03-09 | Discharge: 2018-03-09 | Disposition: A | Discharge status: Home / Self Care | Payer: 59 | Source: Ambulatory Visit | Attending: Sports Medicine | Admitting: Sports Medicine

## 2018-03-09 DIAGNOSIS — M79644 Pain in right finger(s): Secondary | ICD-10-CM

## 2018-03-09 DIAGNOSIS — S62144A Nondisplaced fracture of body of hamate [unciform] bone, right wrist, initial encounter for closed fracture: Secondary | ICD-10-CM | POA: Diagnosis not present

## 2018-03-14 DIAGNOSIS — S62306D Unspecified fracture of fifth metacarpal bone, right hand, subsequent encounter for fracture with routine healing: Secondary | ICD-10-CM | POA: Diagnosis not present

## 2018-03-24 DIAGNOSIS — M170 Bilateral primary osteoarthritis of knee: Secondary | ICD-10-CM | POA: Diagnosis not present

## 2018-03-24 DIAGNOSIS — G894 Chronic pain syndrome: Secondary | ICD-10-CM | POA: Diagnosis not present

## 2018-03-24 DIAGNOSIS — M47817 Spondylosis without myelopathy or radiculopathy, lumbosacral region: Secondary | ICD-10-CM | POA: Diagnosis not present

## 2018-04-04 DIAGNOSIS — S62306D Unspecified fracture of fifth metacarpal bone, right hand, subsequent encounter for fracture with routine healing: Secondary | ICD-10-CM | POA: Diagnosis not present

## 2018-04-13 DIAGNOSIS — G894 Chronic pain syndrome: Secondary | ICD-10-CM | POA: Diagnosis not present

## 2018-04-13 DIAGNOSIS — M47817 Spondylosis without myelopathy or radiculopathy, lumbosacral region: Secondary | ICD-10-CM | POA: Diagnosis not present

## 2018-04-13 DIAGNOSIS — M48062 Spinal stenosis, lumbar region with neurogenic claudication: Secondary | ICD-10-CM | POA: Diagnosis not present

## 2018-04-13 DIAGNOSIS — Z79891 Long term (current) use of opiate analgesic: Secondary | ICD-10-CM | POA: Diagnosis not present

## 2018-05-23 DIAGNOSIS — M47817 Spondylosis without myelopathy or radiculopathy, lumbosacral region: Secondary | ICD-10-CM | POA: Diagnosis not present

## 2018-05-23 DIAGNOSIS — G894 Chronic pain syndrome: Secondary | ICD-10-CM | POA: Diagnosis not present

## 2018-05-23 DIAGNOSIS — M47812 Spondylosis without myelopathy or radiculopathy, cervical region: Secondary | ICD-10-CM | POA: Diagnosis not present

## 2018-06-07 DIAGNOSIS — M48062 Spinal stenosis, lumbar region with neurogenic claudication: Secondary | ICD-10-CM | POA: Diagnosis not present

## 2018-06-20 DIAGNOSIS — G894 Chronic pain syndrome: Secondary | ICD-10-CM | POA: Diagnosis not present

## 2018-06-20 DIAGNOSIS — M47817 Spondylosis without myelopathy or radiculopathy, lumbosacral region: Secondary | ICD-10-CM | POA: Diagnosis not present

## 2018-06-20 DIAGNOSIS — M47812 Spondylosis without myelopathy or radiculopathy, cervical region: Secondary | ICD-10-CM | POA: Diagnosis not present

## 2018-06-29 DIAGNOSIS — Z720 Tobacco use: Secondary | ICD-10-CM | POA: Diagnosis not present

## 2018-06-29 DIAGNOSIS — Z23 Encounter for immunization: Secondary | ICD-10-CM | POA: Diagnosis not present

## 2018-06-29 DIAGNOSIS — E7800 Pure hypercholesterolemia, unspecified: Secondary | ICD-10-CM | POA: Diagnosis not present

## 2018-07-21 DIAGNOSIS — G894 Chronic pain syndrome: Secondary | ICD-10-CM | POA: Diagnosis not present

## 2018-07-21 DIAGNOSIS — M47812 Spondylosis without myelopathy or radiculopathy, cervical region: Secondary | ICD-10-CM | POA: Diagnosis not present

## 2018-07-21 DIAGNOSIS — M47817 Spondylosis without myelopathy or radiculopathy, lumbosacral region: Secondary | ICD-10-CM | POA: Diagnosis not present

## 2018-08-22 ENCOUNTER — Other Ambulatory Visit: Payer: Self-pay | Admitting: Physical Medicine and Rehabilitation

## 2018-08-22 ENCOUNTER — Ambulatory Visit
Admission: RE | Admit: 2018-08-22 | Discharge: 2018-08-22 | Disposition: A | Discharge status: Home / Self Care | Payer: 59 | Source: Ambulatory Visit | Attending: Physical Medicine and Rehabilitation | Admitting: Physical Medicine and Rehabilitation

## 2018-08-22 DIAGNOSIS — T1490XA Injury, unspecified, initial encounter: Principal | ICD-10-CM

## 2018-08-22 DIAGNOSIS — M47817 Spondylosis without myelopathy or radiculopathy, lumbosacral region: Secondary | ICD-10-CM | POA: Diagnosis not present

## 2018-08-22 DIAGNOSIS — W19XXXA Unspecified fall, initial encounter: Secondary | ICD-10-CM

## 2018-08-22 DIAGNOSIS — M25561 Pain in right knee: Secondary | ICD-10-CM

## 2018-08-22 DIAGNOSIS — M47812 Spondylosis without myelopathy or radiculopathy, cervical region: Secondary | ICD-10-CM | POA: Diagnosis not present

## 2018-08-22 DIAGNOSIS — G894 Chronic pain syndrome: Secondary | ICD-10-CM | POA: Diagnosis not present

## 2018-08-22 DIAGNOSIS — S8991XA Unspecified injury of right lower leg, initial encounter: Secondary | ICD-10-CM | POA: Diagnosis not present

## 2018-09-07 DIAGNOSIS — J029 Acute pharyngitis, unspecified: Secondary | ICD-10-CM | POA: Diagnosis not present

## 2018-09-13 DIAGNOSIS — J029 Acute pharyngitis, unspecified: Secondary | ICD-10-CM | POA: Diagnosis not present

## 2018-09-13 DIAGNOSIS — J343 Hypertrophy of nasal turbinates: Secondary | ICD-10-CM | POA: Diagnosis not present

## 2018-09-13 DIAGNOSIS — J358 Other chronic diseases of tonsils and adenoids: Secondary | ICD-10-CM | POA: Diagnosis not present

## 2018-09-19 DIAGNOSIS — M47812 Spondylosis without myelopathy or radiculopathy, cervical region: Secondary | ICD-10-CM | POA: Diagnosis not present

## 2018-09-19 DIAGNOSIS — M47817 Spondylosis without myelopathy or radiculopathy, lumbosacral region: Secondary | ICD-10-CM | POA: Diagnosis not present

## 2018-09-19 DIAGNOSIS — G894 Chronic pain syndrome: Secondary | ICD-10-CM | POA: Diagnosis not present

## 2018-10-03 DIAGNOSIS — E782 Mixed hyperlipidemia: Secondary | ICD-10-CM | POA: Diagnosis not present

## 2018-10-18 DIAGNOSIS — M170 Bilateral primary osteoarthritis of knee: Secondary | ICD-10-CM | POA: Diagnosis not present

## 2018-10-18 DIAGNOSIS — M47812 Spondylosis without myelopathy or radiculopathy, cervical region: Secondary | ICD-10-CM | POA: Diagnosis not present

## 2018-10-18 DIAGNOSIS — M47817 Spondylosis without myelopathy or radiculopathy, lumbosacral region: Secondary | ICD-10-CM | POA: Diagnosis not present

## 2018-10-18 DIAGNOSIS — G894 Chronic pain syndrome: Secondary | ICD-10-CM | POA: Diagnosis not present

## 2018-11-15 DIAGNOSIS — G894 Chronic pain syndrome: Secondary | ICD-10-CM | POA: Diagnosis not present

## 2018-11-15 DIAGNOSIS — M47817 Spondylosis without myelopathy or radiculopathy, lumbosacral region: Secondary | ICD-10-CM | POA: Diagnosis not present

## 2018-11-15 DIAGNOSIS — M47812 Spondylosis without myelopathy or radiculopathy, cervical region: Secondary | ICD-10-CM | POA: Diagnosis not present

## 2018-11-15 DIAGNOSIS — Z79891 Long term (current) use of opiate analgesic: Secondary | ICD-10-CM | POA: Diagnosis not present

## 2018-11-28 ENCOUNTER — Other Ambulatory Visit: Payer: Self-pay | Admitting: Family Medicine

## 2018-11-28 ENCOUNTER — Ambulatory Visit
Admission: RE | Admit: 2018-11-28 | Discharge: 2018-11-28 | Disposition: A | Discharge status: Home / Self Care | Payer: 59 | Source: Ambulatory Visit | Attending: Family Medicine | Admitting: Family Medicine

## 2018-11-28 DIAGNOSIS — R0789 Other chest pain: Secondary | ICD-10-CM | POA: Diagnosis not present

## 2018-11-28 DIAGNOSIS — R079 Chest pain, unspecified: Secondary | ICD-10-CM | POA: Diagnosis not present

## 2018-11-28 DIAGNOSIS — R5383 Other fatigue: Secondary | ICD-10-CM | POA: Diagnosis not present

## 2018-11-28 DIAGNOSIS — K219 Gastro-esophageal reflux disease without esophagitis: Secondary | ICD-10-CM | POA: Diagnosis not present

## 2018-12-15 DIAGNOSIS — M47812 Spondylosis without myelopathy or radiculopathy, cervical region: Secondary | ICD-10-CM | POA: Diagnosis not present

## 2018-12-15 DIAGNOSIS — G894 Chronic pain syndrome: Secondary | ICD-10-CM | POA: Diagnosis not present

## 2018-12-15 DIAGNOSIS — M47817 Spondylosis without myelopathy or radiculopathy, lumbosacral region: Secondary | ICD-10-CM | POA: Diagnosis not present

## 2018-12-29 DIAGNOSIS — E7800 Pure hypercholesterolemia, unspecified: Secondary | ICD-10-CM | POA: Diagnosis not present

## 2018-12-29 DIAGNOSIS — Z0000 Encounter for general adult medical examination without abnormal findings: Secondary | ICD-10-CM | POA: Diagnosis not present

## 2019-02-17 DIAGNOSIS — Z720 Tobacco use: Secondary | ICD-10-CM | POA: Diagnosis not present

## 2019-02-17 DIAGNOSIS — R1013 Epigastric pain: Secondary | ICD-10-CM | POA: Diagnosis not present

## 2019-02-28 ENCOUNTER — Other Ambulatory Visit: Payer: Self-pay | Admitting: Gastroenterology

## 2019-02-28 DIAGNOSIS — R6881 Early satiety: Secondary | ICD-10-CM | POA: Diagnosis not present

## 2019-02-28 DIAGNOSIS — R1013 Epigastric pain: Secondary | ICD-10-CM

## 2019-02-28 DIAGNOSIS — K219 Gastro-esophageal reflux disease without esophagitis: Secondary | ICD-10-CM | POA: Diagnosis not present

## 2019-03-02 DIAGNOSIS — R1013 Epigastric pain: Secondary | ICD-10-CM | POA: Diagnosis not present

## 2019-03-07 ENCOUNTER — Other Ambulatory Visit: Payer: Self-pay

## 2019-03-07 ENCOUNTER — Ambulatory Visit
Admission: RE | Admit: 2019-03-07 | Discharge: 2019-03-07 | Disposition: A | Discharge status: Home / Self Care | Payer: 59 | Source: Ambulatory Visit | Attending: Gastroenterology | Admitting: Gastroenterology

## 2019-03-07 DIAGNOSIS — R1013 Epigastric pain: Secondary | ICD-10-CM | POA: Diagnosis not present

## 2019-03-07 DIAGNOSIS — R6881 Early satiety: Secondary | ICD-10-CM

## 2019-03-07 MED ORDER — IOPAMIDOL (ISOVUE-300) INJECTION 61%
100.0000 mL | Freq: Once | INTRAVENOUS | Status: AC | PRN
  Administered 2019-03-07: 100 mL via INTRAVENOUS

## 2019-03-13 ENCOUNTER — Other Ambulatory Visit: Payer: Self-pay | Admitting: Gastroenterology

## 2019-03-13 DIAGNOSIS — K769 Liver disease, unspecified: Secondary | ICD-10-CM

## 2019-03-16 ENCOUNTER — Other Ambulatory Visit: Payer: 59

## 2019-03-23 ENCOUNTER — Other Ambulatory Visit: Payer: Self-pay

## 2019-03-23 ENCOUNTER — Ambulatory Visit
Admission: RE | Admit: 2019-03-23 | Discharge: 2019-03-23 | Disposition: A | Discharge status: Home / Self Care | Payer: 59 | Source: Ambulatory Visit | Attending: Gastroenterology | Admitting: Gastroenterology

## 2019-03-23 DIAGNOSIS — K769 Liver disease, unspecified: Secondary | ICD-10-CM

## 2019-03-23 MED ORDER — GADOBENATE DIMEGLUMINE 529 MG/ML IV SOLN
15.0000 mL | Freq: Once | INTRAVENOUS | Status: AC | PRN
  Administered 2019-03-23: 15 mL via INTRAVENOUS

## 2019-04-24 IMAGING — MR MR CERVICAL SPINE W/O CM
4 of 5 series · 28 of 48 positions shown · non-contrast
Comparison: Outside Cervical spine MRI 03/08/2015. [REDACTED] cervical spine MRI 04/17/2014.

CLINICAL DATA: 40-year-old male with chronic cervical neck pain and
radiculopathy. Prior surgery.

EXAM:
MRI CERVICAL SPINE WITHOUT CONTRAST
TECHNIQUE: Multiplanar, multisequence MR imaging of the cervical spine was
performed. No intravenous contrast was administered.

[Series 2: T2 · sagittal · 3.0mm · 0.41mm/px · 7 of 13 slices shown (1 of 2)]
[im 1/13]
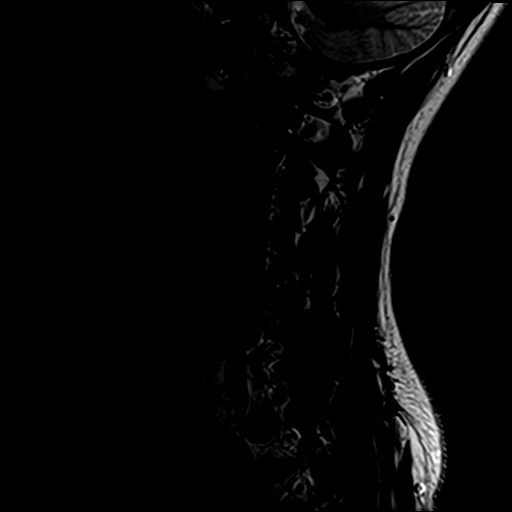
[im 3/13]
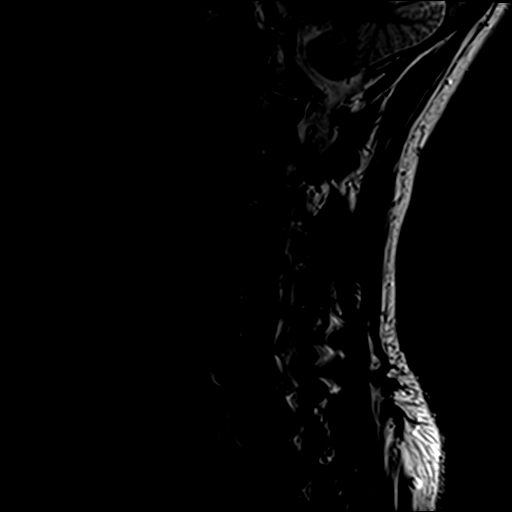
[im 5/13]
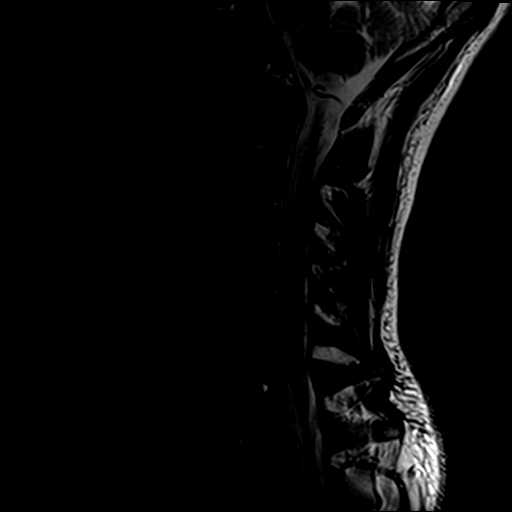
[im 7/13]
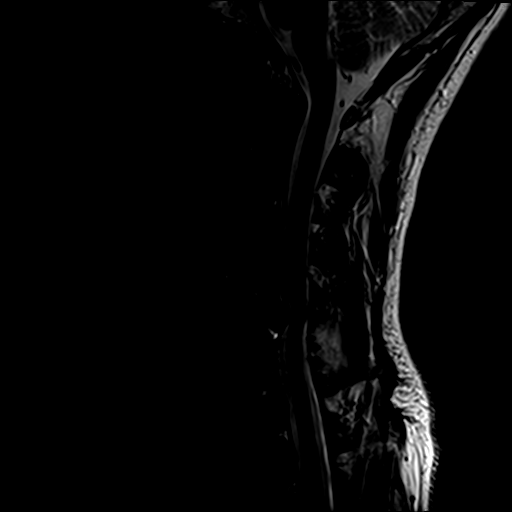
[im 9/13]
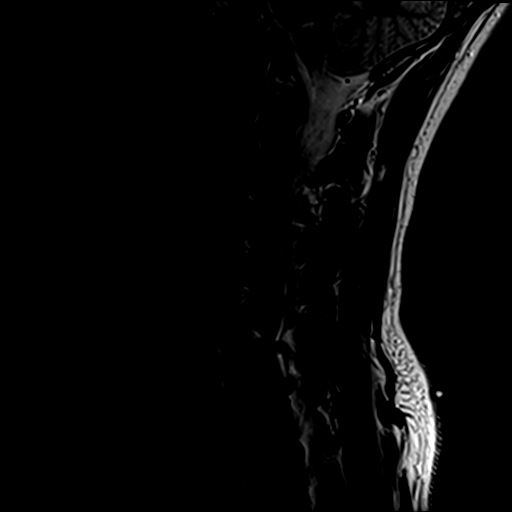
[im 11/13]
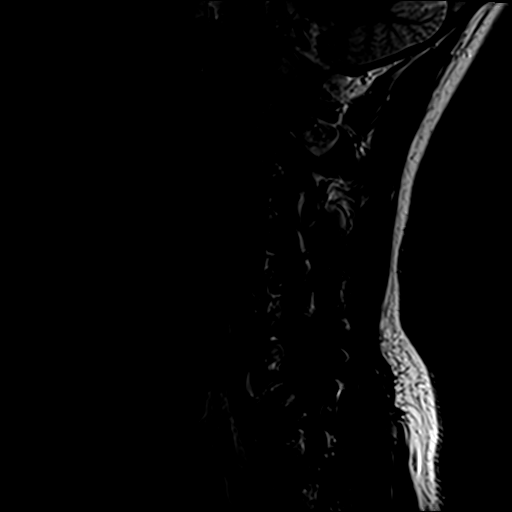
[im 13/13]
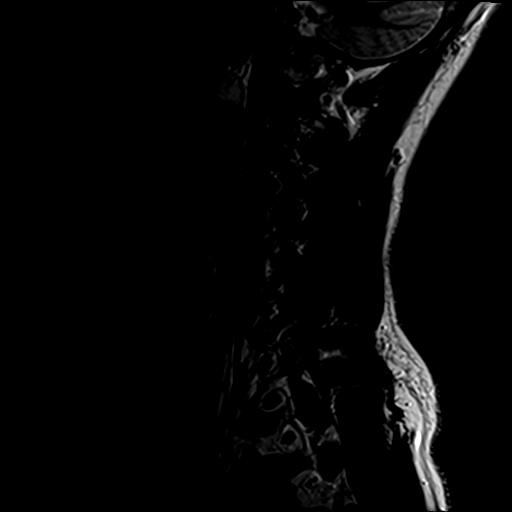

[Series 3: T1 · sagittal · 3.0mm · 0.41mm/px · 7 of 13 slices shown]
[im 1/13]
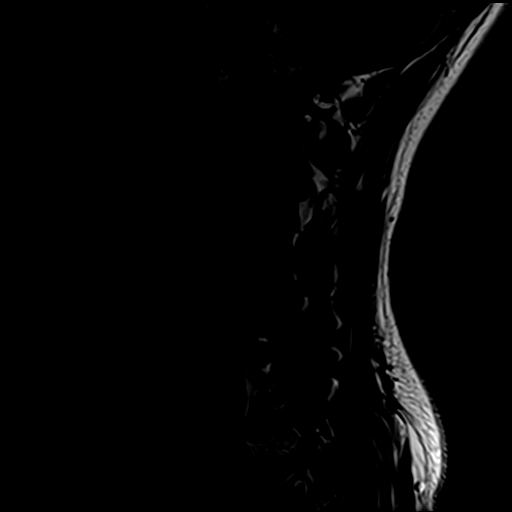
[im 3/13]
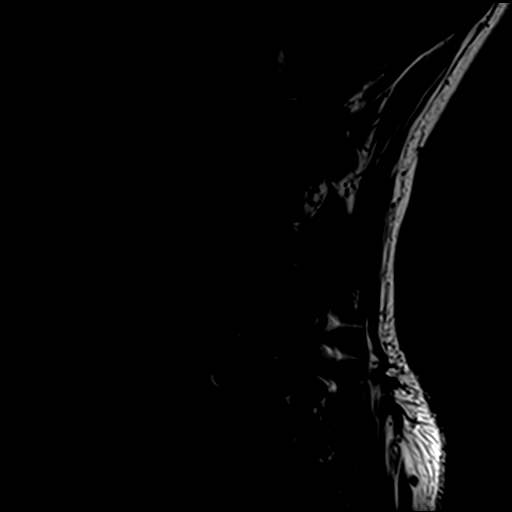
[im 5/13]
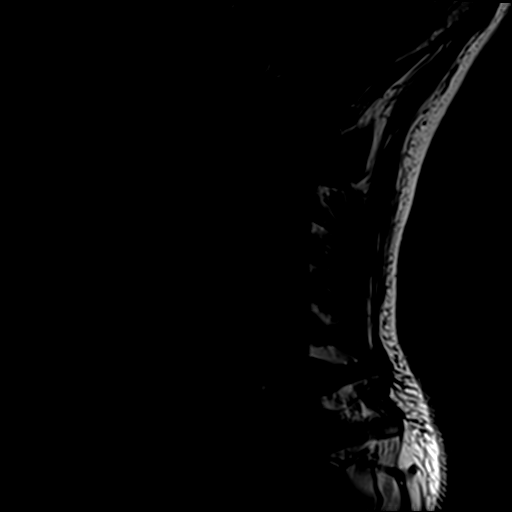
[im 7/13]
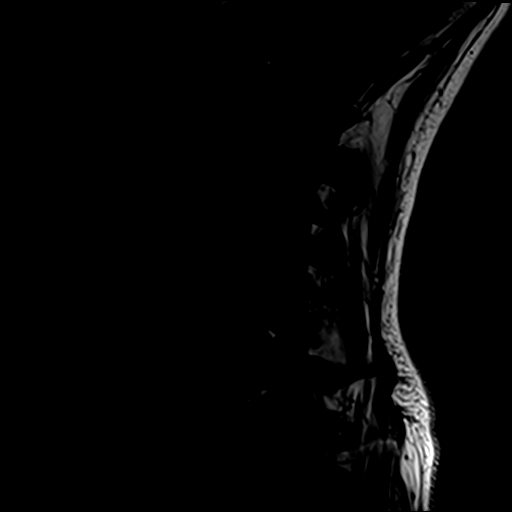
[im 9/13]
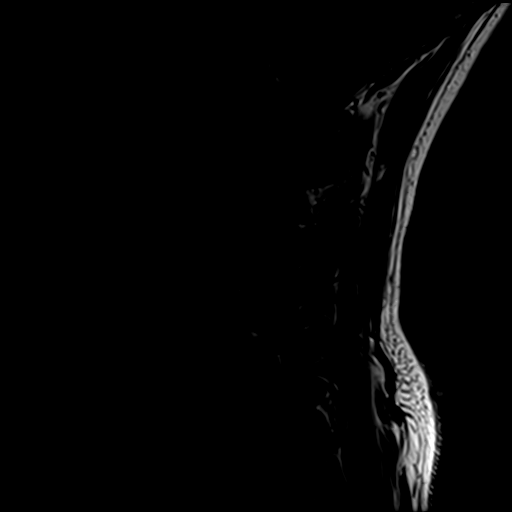
[im 11/13]
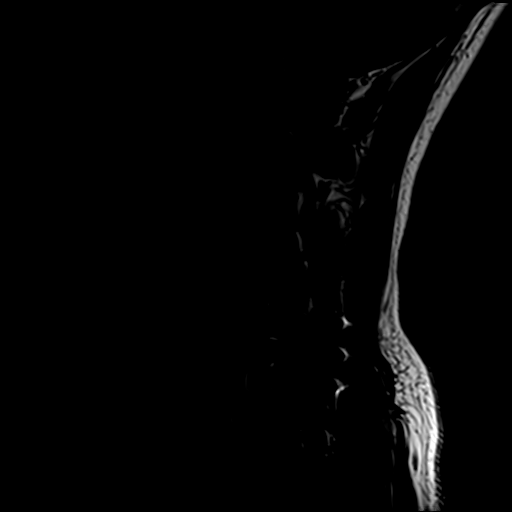
[im 13/13]
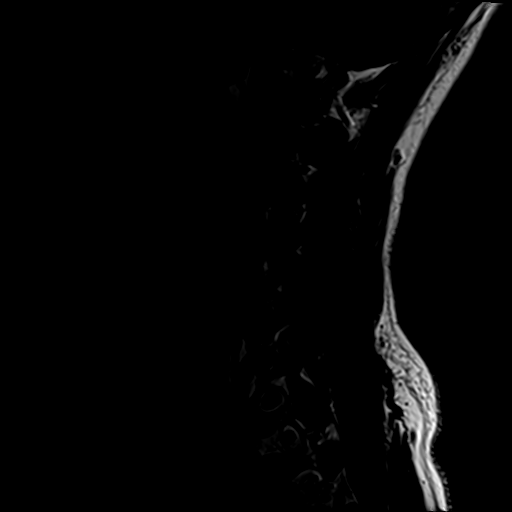

[Series 4: STIR · sagittal · 3.0mm · 0.82mm/px · 5 of 13 slices shown]
[im 1/13]
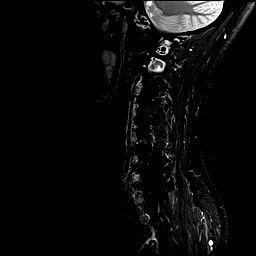
[im 3/13]
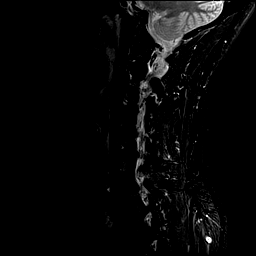
[im 5/13]
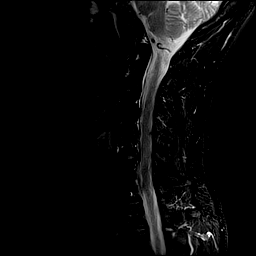
[im 7/13]
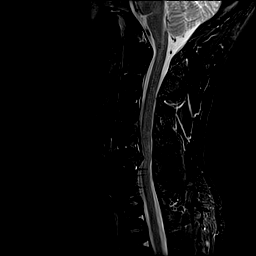
[im 11/13]
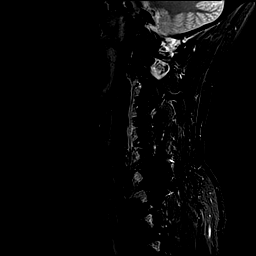

[Series 6: T2 · axial · 3.0mm · 0.70mm/px · z∈[-78,+17]mm · 9 of 26 slices shown (2 of 2)]
[im 1/26]
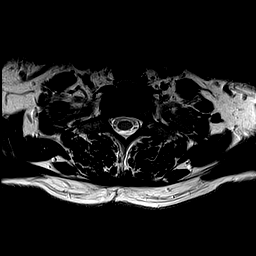
[im 5/26]
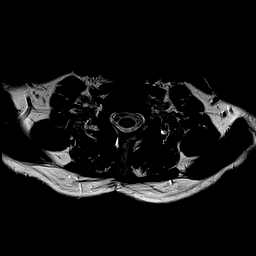
[im 9/26]
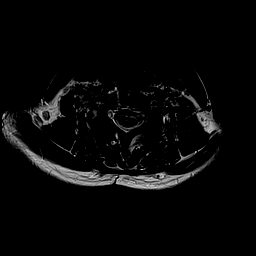
[im 11/26]
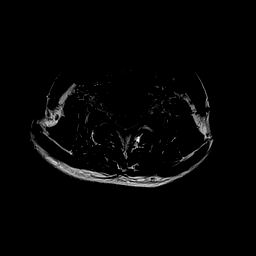
[im 13/26]
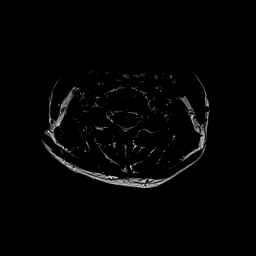
[im 15/26]
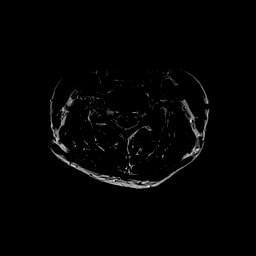
[im 17/26]
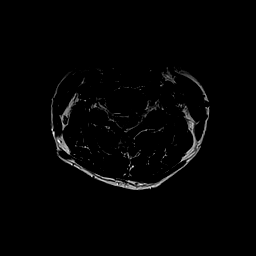
[im 21/26]
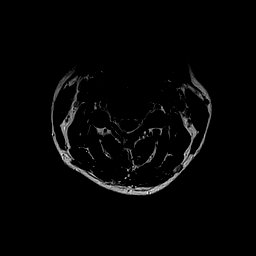
[im 26/26]
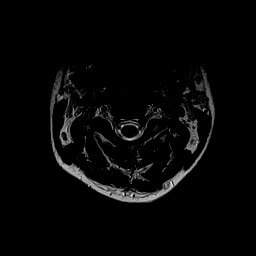

[28 of 48 positions shown; findings below may reference images not displayed]

FINDINGS: Alignment: Stable, mild straightening of cervical lordosis.

Vertebrae: Chronic hardware susceptibility artifact at C6-C7 related
to anterior and posterior fusion hardware placement. No marrow edema
or evidence of acute osseous abnormality. Visualized bone marrow
signal is within normal limits.

Cord: Spinal cord signal is within normal limits at all visualized
levels.

Posterior Fossa, vertebral arteries, paraspinal tissues:
Cervicomedullary junction is within normal limits. Negative
visualized posterior fossa. Preserved major vascular flow voids in
the neck. Negative neck soft tissues.

Disc levels:

C2-C3:  Negative.

C3-C4: Mild disc bulging and endplate spurring. Borderline to mild
C4 neural foraminal stenosis.

C4-C5: Small right paracentral disc protrusion may be mildly
increased since 7310 (series 6, image 11). Borderline to mild
associated spinal stenosis. Subtle cord mass effect. Superimposed
borderline to mild right C5 foraminal stenosis but primarily due to
endplate spurring.

C5-C6: Chronic but increased broad-based and moderate-sized left
paracentral disc extrusion (series 6, image 16 and series 2, image
7). Spinal stenosis with mild mass effect on the left hemi cord.
Involvement of the proximal left neural foramen. Superimposed mostly
foraminal and far lateral disc bulging and endplate spurring appears
stable. Mild right foraminal stenosis appears stable.

C6-C7:  Previous ACDF and posterior hardware fusion.  No stenosis.

C7-T1: Mild far lateral disc bulging and endplate spurring. Mild
facet hypertrophy. No stenosis.

T1-T2:  Stable and negative.
IMPRESSION: 1. Chronic C6-C7 fusion with no adverse features, but progressed
adjacent segment disease at C5-C6 in the form of an increased
moderate-sized leftward disc herniation. Spinal stenosis with mild
cord mass effect. Query left C6 radiculitis. No spinal cord signal
abnormality.
2. Small right paracentral disc herniation at C4-C5 also may be
mildly increased since 7310. Up to mild spinal stenosis.

## 2020-02-29 ENCOUNTER — Other Ambulatory Visit: Payer: Self-pay | Admitting: Neurosurgery

## 2020-03-18 NOTE — H&P (Signed)
Patient ID:   [id number] Patient: Peter Valdez  Date of Birth: 11-14-1975 Visit Type: Office Visit   Date: 02/28/2020 03:15 PM Provider: Marchia Meiers. Vertell Limber MD   This 44 year old male presents for MRI Review.  HISTORY OF PRESENT ILLNESS: 1.  MRI Review  The MRI demonstrates a recurrent disc herniation L4-5 on the right.  The patient has right dorsiflexion weakness and is quite miserable.  He currently grades his pain at 7/10 in severity.  I have recommended redo microdiskectomy but believe that he needs to undergo a TLIF fusion.  He has had a significant amount of his right L4-5 facet joint removed in the past and I do not think that we will be able to perform a microdiskectomy and retain a functional joint at this level so I have recommended redo diskectomy along with fusion.  Patient had previous microdiskectomy and then had a 2nd surgery for repair of CSF leak.  This would be the 3rd surgery in this location.  The remaining levels of the lumbar spine do not appear to be significantly problematic.      Medical/Surgical/Interim History Reviewed, no change.  Last detailed document date:01/17/2018.     PAST MEDICAL HISTORY, SURGICAL HISTORY, FAMILY HISTORY, SOCIAL HISTORY AND REVIEW OF SYSTEMS I have reviewed the patient's past medical, surgical, family and social history as well as the comprehensive review of systems as included on the [facility] history form dated 01/31/2020, which I have signed.  Family History: Reviewed, no changes.  Last detailed document date:08/15/2014.   Social History: Reviewed, no changes. Last detailed document date: 08/15/2014.    MEDICATIONS: (added, continued or stopped this visit) Started Medication Directions Instruction Stopped  bupropion HCl SR 150 mg tablet,sustained-release take 1 tablet by oral route  every day    gabapentin 300 mg capsule take 2 capsule by oral route 3 times every day    morphine  ER 30 mg tablet,extended release take 1 tablet by oral route 3 times every day    omeprazole 40 mg capsule,delayed release take 1 capsule by oral route  every day before a meal    oxycodone-acetaminophen 7.5 mg-325 mg tablet take 1 tablet by oral route  every 6 hours as needed    trazodone 200mg  ORAL TABLET 1 tablet at bedtime      ALLERGIES: Ingredient Reaction Medication Name Comment CELECOXIB Unknown CELEBREX   Reviewed, no changes.    PHYSICAL EXAM:  Vitals Date Temp F BP Pulse Ht In Wt Lb BMI BSA Pain Score 02/28/2020  125/76 105 69 159.6 23.57  7/10     IMPRESSION:  Patient is in miserable pain.  He has a significant recurrent disc herniation L4-5 on the right.  He is weak in his right leg.  PLAN: I have recommended proceeding with surgery.  This will consist of right L4-5 TLIF and this will be performed at [facility] on 03/29/2020.  He was given a prescription for an LSO brace.  Orders: Diagnostic Procedures: Assessment Procedure M54.16 Lumbar Spine- AP/Lat Instruction(s)/Education: Assessment Instruction R03.0 Lifestyle education Miscellaneous: Assessment  M48.061 LSO Brace  Completed Orders (this encounter) Order Details Reason Side Interpretation Result Initial Treatment Date Region Lifestyle education Patient will monitor and contact primary care physician if needed.        Assessment/Plan  # Detail Type Description  1. Assessment Low back pain, unspecified back pain laterality, with sciatica presence unspecified (M54.5).     2. Assessment Radiculopathy, lumbar region (M54.16).  3. Assessment HNP (herniated nucleus pulposus), lumbar (M51.26).     4. Assessment Foraminal stenosis of lumbar region (M48.061).  Plan Orders LSO Brace. Clinical information/comments: given to pt.     5. Assessment Elevated blood-pressure reading, w/o diagnosis of htn  (R03.0).       Pain Management Plan Pain Scale: 7/10. Method: Numeric Pain Intensity Scale. Location: back. Onset: 12/16/2014. Duration: varies. Quality: discomforting. Pain management follow-up plan of care: Patient will continue medication management..              Provider:  Danae Orleans. Venetia Maxon MD  02/29/2020 06:50 PM    Dictation edited by: Danae Orleans. Venetia Maxon    CC Providers: [facility] [address] [place],  [place]  [address]-   [facility] [address] [place] [address] [place], [place] [address]-               Electronically signed by Danae Orleans Venetia Maxon MD on 02/29/2020 06:50 PM

## 2020-03-25 NOTE — Progress Notes (Signed)
Your procedure is scheduled on Friday May 28.  Report to [facility] Main Entrance "A" at 09:15 A.M., and check in at the Admitting office.  Call this number if you have problems the morning of surgery: [phone]  Call [phone] if you have any questions prior to your surgery date Monday-Friday 8am-4pm   Remember: Do not eat or drink after midnight the night before your surgery    Take these medicines the morning of surgery with A SIP OF WATER: baclofen (LIORESAL) gabapentin (NEURONTIN) omeprazole (PRILOSEC) oxyCODONE-acetaminophen (PERCOCET) pravastatin (PRAVACHOL)  If needed: morphine (MS CONTIN)    As of today, STOP taking any Aspirin (unless otherwise instructed by your surgeon), Aleve, Naproxen, Ibuprofen, Motrin, Advil, Goody's, BC's, all herbal medications, fish oil, and all vitamins.    The Morning of Surgery  Do not wear jewelry, make-up or nail polish.  Do not wear lotions, powders, colognes, or deodorant Men may shave face and neck.  Do not bring valuables to the hospital.  [facility] is not responsible for any belongings or valuables.  If you are a smoker, DO NOT Smoke 24 hours prior to surgery  If you wear a CPAP at night please bring your mask the morning of surgery   Remember that you must have someone to transport you home after your surgery, and remain with you for 24 hours if you are discharged the same day.   Please bring cases for contacts, glasses, hearing aids, dentures or bridgework because it cannot be worn into surgery.    Leave your suitcase in the car.  After surgery it may be brought to your room.  For patients admitted to the hospital, discharge time will be determined by your treatment team.  Patients discharged the day of surgery will not be allowed to drive home.    Special instructions:   [facility]- Preparing For Surgery  Before surgery, you can play an important role. Because skin is not sterile, your skin needs to be  as free of germs as possible. You can reduce the number of germs on your skin by washing with CHG (chlorahexidine gluconate) Soap before surgery.  CHG is an antiseptic cleaner which kills germs and bonds with the skin to continue killing germs even after washing.    Oral Hygiene is also important to reduce your risk of infection.  Remember - BRUSH YOUR TEETH THE MORNING OF SURGERY WITH YOUR REGULAR TOOTHPASTE  Please do not use if you have an allergy to CHG or antibacterial soaps. If your skin becomes reddened/irritated stop using the CHG.  Do not shave (including legs and underarms) for at least 48 hours prior to first CHG shower. It is OK to shave your face.  Please follow these instructions carefully.   1. Shower the NIGHT BEFORE SURGERY and the MORNING OF SURGERY with CHG Soap.   2. If you chose to wash your hair and body, wash as usual with your normal shampoo and body-wash/soap.  3. Rinse your hair and body thoroughly to remove the shampoo and soap.  4. Apply CHG directly to the skin (ONLY FROM THE NECK DOWN) and wash gently with a scrungie or a clean washcloth.   5. Do not use on open wounds or open sores. Avoid contact with your eyes, ears, mouth and genitals (private parts). Wash Face and genitals (private parts)  with your normal soap.   6. Wash thoroughly, paying special attention to the area where your surgery will be performed.  7. Thoroughly rinse  your body with warm water from the neck down.  8. DO NOT shower/wash with your normal soap after using and rinsing off the CHG Soap.  9. Pat yourself dry with a CLEAN TOWEL.  10. Wear CLEAN PAJAMAS to bed the night before surgery  11. Place CLEAN SHEETS on your bed the night of your first shower and DO NOT SLEEP WITH PETS.  12. Wear comfortable clothes the morning of surgery.     Day of Surgery:  Please shower the morning of surgery with the CHG soap Do not apply any deodorants/lotions. Please wear clean clothes to the  hospital/surgery center.   Remember to brush your teeth WITH YOUR REGULAR TOOTHPASTE.   Please read over the following fact sheets that you were given.

## 2020-03-26 ENCOUNTER — Encounter (HOSPITAL_COMMUNITY)
Admission: RE | Admit: 2020-03-26 | Discharge: 2020-03-26 | Disposition: A | Discharge status: Home / Self Care | Payer: 59 | Source: Ambulatory Visit | Attending: Neurosurgery | Admitting: Neurosurgery

## 2020-03-26 ENCOUNTER — Encounter (HOSPITAL_COMMUNITY): Payer: Self-pay

## 2020-03-26 ENCOUNTER — Other Ambulatory Visit (HOSPITAL_COMMUNITY)
Admission: RE | Admit: 2020-03-26 | Discharge: 2020-03-26 | Disposition: A | Discharge status: Home / Self Care | Payer: 59 | Source: Ambulatory Visit | Attending: Neurosurgery | Admitting: Neurosurgery

## 2020-03-26 ENCOUNTER — Other Ambulatory Visit: Payer: Self-pay

## 2020-03-26 DIAGNOSIS — Z01812 Encounter for preprocedural laboratory examination: Secondary | ICD-10-CM | POA: Insufficient documentation

## 2020-03-26 HISTORY — DX: Depression, unspecified: F32.A

## 2020-03-26 HISTORY — DX: Anxiety disorder, unspecified: F41.9

## 2020-03-26 LAB — SURGICAL PCR SCREEN
MRSA, PCR: NEGATIVE
Staphylococcus aureus: NEGATIVE

## 2020-03-26 LAB — BASIC METABOLIC PANEL
Anion gap: 7 (ref 5–15)
BUN: 9 mg/dL (ref 6–20)
CO2: 29 mmol/L (ref 22–32)
Calcium: 9.2 mg/dL (ref 8.9–10.3)
Chloride: 108 mmol/L (ref 98–111)
Creatinine, Ser: 0.87 mg/dL (ref 0.61–1.24)
GFR calc Af Amer: >60 mL/min (ref >60)
GFR calc non Af Amer: >60 mL/min (ref >60)
Glucose, Bld: 93 mg/dL (ref 70–99)
Potassium: 5 mmol/L (ref 3.5–5.1)
Sodium: 144 mmol/L (ref 135–145)

## 2020-03-26 LAB — CBC
HCT: 42.7 % (ref 39.0–52.0)
Hemoglobin: 14 g/dL (ref 13.0–17.0)
MCH: 31.6 pg (ref 26.0–34.0)
MCHC: 32.8 g/dL (ref 30.0–36.0)
MCV: 96.4 fL (ref 80.0–100.0)
Platelets: 275 10*3/uL (ref 150–400)
RBC: 4.43 MIL/uL (ref 4.22–5.81)
RDW: 13 % (ref 11.5–15.5)
WBC: 9.5 10*3/uL (ref 4.0–10.5)
nRBC: 0 % (ref 0.0–0.2)

## 2020-03-26 LAB — TYPE AND SCREEN
ABO/RH(D): A POS
Antibody Screen: NEGATIVE

## 2020-03-26 LAB — ABO/RH: ABO/RH(D): A POS

## 2020-03-26 LAB — SARS CORONAVIRUS 2 (TAT 6-24 HRS): SARS Coronavirus 2: NEGATIVE

## 2020-03-26 NOTE — Progress Notes (Signed)
PCP Clelia Croft, MD Cardiologist - pt denies  PPM/ICD - n/a  Pt did not have any medical or surgery hx documented.  Pt has no hx of HTM or DM2. No shortness of breath or chest pain to report today.   Chest x-ray - n/a EKG - n/a Stress Test - pt denies  ECHO - pt denies Cardiac Cath - pt denies  Sleep Study - yes 2016 (neg results)  CPAP - no  Blood Thinner Instructions:n/a Aspirin Instructions:n/a  ERAS Protcol - n/a PRE-SURGERY Ensure or G2- n/a  COVID TEST- 03/26/20  Coronavirus Screening  Have you experienced the following symptoms:  Cough yes/no: No Fever (>100.39F)  yes/no: No Runny nose yes/no: No Sore throat yes/no: No Difficulty breathing/shortness of breath  yes/no: No  Have you or a family member traveled in the last 14 days and where? yes/no: No   If the patient indicates "YES" to the above questions, their PAT will be rescheduled to limit the exposure to others and, the surgeon will be notified. THE PATIENT WILL NEED TO BE ASYMPTOMATIC FOR 14 DAYS.   If the patient is not experiencing any of these symptoms, the PAT nurse will instruct them to NOT bring anyone with them to their appointment since they may have these symptoms or traveled as well.   Please remind your patients and families that hospital visitation restrictions are in effect and the importance of the restrictions.     During PAT appt, pt stated he's had 6 prior surgeries by Dr. Venetia Maxon at [facility]. > 08/13/2005 - Anterior cervical decompression and fusion C6-7 with  PEEK interbody cage, morcellized bone autograft, and anterior cervical  plate > 1/61/0960 - Posterior cervical fusion with lateral mass screws, C6-7 level with      morselized allograft (Osteocel), posterolateral arthodesis > 11/08/2008 -  Left ulnar nerve decompression > (per pt) 10/25/2011 - right L4-L5 microdiskectomy >  12/06/2011 (per pt) L4-L5 repair fluid spinal leak  >11/04/2017 (per pt) C5-C6 artificial disc placed (done at  surgery center)   Anesthesia review: n/a  Patient denies shortness of breath, fever, cough and chest pain at PAT appointment   All instructions explained to the patient, with a verbal understanding of the material. Patient agrees to go over the instructions while at home for a better understanding. Patient also instructed to self quarantine after being tested for COVID-19. The opportunity to ask questions was provided.

## 2020-03-29 ENCOUNTER — Encounter (HOSPITAL_COMMUNITY)
Admission: RE | Disposition: A | Discharge status: Home / Self Care | Payer: Self-pay | Source: Ambulatory Visit | Attending: Neurosurgery

## 2020-03-29 ENCOUNTER — Inpatient Hospital Stay (HOSPITAL_COMMUNITY): Payer: 59 | Admitting: Anesthesiology

## 2020-03-29 ENCOUNTER — Inpatient Hospital Stay (HOSPITAL_COMMUNITY): Payer: 59

## 2020-03-29 ENCOUNTER — Other Ambulatory Visit: Payer: Self-pay

## 2020-03-29 ENCOUNTER — Inpatient Hospital Stay (HOSPITAL_COMMUNITY)
Admission: RE | Admit: 2020-03-29 | Discharge: 2020-03-30 | DRG: 455 | Disposition: A | Discharge status: Home / Self Care | Payer: 59 | Source: Ambulatory Visit | Attending: Neurosurgery | Admitting: Neurosurgery

## 2020-03-29 ENCOUNTER — Inpatient Hospital Stay (HOSPITAL_COMMUNITY): Payer: 59 | Admitting: Physician Assistant

## 2020-03-29 DIAGNOSIS — Z419 Encounter for procedure for purposes other than remedying health state, unspecified: Secondary | ICD-10-CM

## 2020-03-29 DIAGNOSIS — M4726 Other spondylosis with radiculopathy, lumbar region: Secondary | ICD-10-CM | POA: Diagnosis present

## 2020-03-29 DIAGNOSIS — M5126 Other intervertebral disc displacement, lumbar region: Secondary | ICD-10-CM | POA: Diagnosis present

## 2020-03-29 DIAGNOSIS — M5116 Intervertebral disc disorders with radiculopathy, lumbar region: Secondary | ICD-10-CM | POA: Diagnosis present

## 2020-03-29 DIAGNOSIS — Z20822 Contact with and (suspected) exposure to covid-19: Secondary | ICD-10-CM | POA: Diagnosis present

## 2020-03-29 DIAGNOSIS — M48061 Spinal stenosis, lumbar region without neurogenic claudication: Secondary | ICD-10-CM | POA: Diagnosis present

## 2020-03-29 HISTORY — PX: TRANSFORAMINAL LUMBAR INTERBODY FUSION (TLIF) WITH PEDICLE SCREW FIXATION 1 LEVEL: SHX6141

## 2020-03-29 SURGERY — TRANSFORAMINAL LUMBAR INTERBODY FUSION (TLIF) WITH PEDICLE SCREW FIXATION 1 LEVEL
Anesthesia: General | Site: Back | Laterality: Right

## 2020-03-29 MED ORDER — BUPIVACAINE LIPOSOME 1.3 % IJ SUSP
20.0000 mL | Freq: Once | INTRAMUSCULAR | Status: DC
  Filled 2020-03-29: qty 20

## 2020-03-29 MED ORDER — FENTANYL CITRATE (PF) 100 MCG/2ML IJ SOLN
25.0000 ug | INTRAMUSCULAR | Status: DC | PRN
  Administered 2020-03-29 (×3): 50 ug via INTRAVENOUS

## 2020-03-29 MED ORDER — SUGAMMADEX SODIUM 200 MG/2ML IV SOLN
INTRAVENOUS | Status: DC | PRN
Start: 2020-03-29 — End: 2020-03-29
  Administered 2020-03-29: 200 mg via INTRAVENOUS

## 2020-03-29 MED ORDER — ROCURONIUM BROMIDE 10 MG/ML (PF) SYRINGE
PREFILLED_SYRINGE | INTRAVENOUS | Status: AC
  Filled 2020-03-29: qty 10

## 2020-03-29 MED ORDER — FENTANYL CITRATE (PF) 250 MCG/5ML IJ SOLN
INTRAMUSCULAR | Status: DC | PRN
  Administered 2020-03-29: 50 ug via INTRAVENOUS
  Administered 2020-03-29: 100 ug via INTRAVENOUS
  Administered 2020-03-29 (×2): 50 ug via INTRAVENOUS

## 2020-03-29 MED ORDER — PANTOPRAZOLE SODIUM 40 MG IV SOLR
40.0000 mg | Freq: Every day | INTRAVENOUS | Status: DC
Start: 2020-03-29 — End: 2020-03-29

## 2020-03-29 MED ORDER — OXYCODONE HCL 5 MG/5ML PO SOLN: 5.0000 mg | Freq: Once | ORAL | Status: DC | PRN

## 2020-03-29 MED ORDER — GABAPENTIN 400 MG PO CAPS
800.0000 mg | ORAL_CAPSULE | Freq: Three times a day (TID) | ORAL | Status: DC
  Administered 2020-03-29 – 2020-03-30 (×3): 800 mg via ORAL
  Filled 2020-03-29 (×3): qty 2

## 2020-03-29 MED ORDER — THROMBIN 20000 UNITS EX SOLR
CUTANEOUS | Status: AC
  Filled 2020-03-29: qty 20000

## 2020-03-29 MED ORDER — OXYCODONE HCL 5 MG PO TABS: 5.0000 mg | ORAL_TABLET | Freq: Once | ORAL | Status: DC | PRN

## 2020-03-29 MED ORDER — DEXAMETHASONE SODIUM PHOSPHATE 10 MG/ML IJ SOLN
INTRAMUSCULAR | Status: DC | PRN
  Administered 2020-03-29: 10 mg via INTRAVENOUS

## 2020-03-29 MED ORDER — BUPIVACAINE LIPOSOME 1.3 % IJ SUSP
INTRAMUSCULAR | Status: DC | PRN
  Administered 2020-03-29: 20 mL

## 2020-03-29 MED ORDER — LACTATED RINGERS IV SOLN: INTRAVENOUS | Status: DC | PRN

## 2020-03-29 MED ORDER — LIDOCAINE-EPINEPHRINE 1 %-1:100000 IJ SOLN
INTRAMUSCULAR | Status: AC
  Filled 2020-03-29: qty 1

## 2020-03-29 MED ORDER — HYDROMORPHONE HCL 1 MG/ML IJ SOLN
0.5000 mg | INTRAMUSCULAR | Status: DC | PRN
  Administered 2020-03-29: 1 mg via INTRAVENOUS
  Filled 2020-03-29: qty 1

## 2020-03-29 MED ORDER — ONDANSETRON HCL 4 MG/2ML IJ SOLN: 4.0000 mg | Freq: Four times a day (QID) | INTRAMUSCULAR | Status: DC | PRN

## 2020-03-29 MED ORDER — DOCUSATE SODIUM 100 MG PO CAPS
100.0000 mg | ORAL_CAPSULE | Freq: Two times a day (BID) | ORAL | Status: DC
  Administered 2020-03-29 – 2020-03-30 (×2): 100 mg via ORAL
  Filled 2020-03-29 (×3): qty 1

## 2020-03-29 MED ORDER — HYDROCODONE-ACETAMINOPHEN 5-325 MG PO TABS: 1.0000 | ORAL_TABLET | ORAL | Status: DC | PRN

## 2020-03-29 MED ORDER — SODIUM CHLORIDE 0.9% FLUSH: 3.0000 mL | Freq: Two times a day (BID) | INTRAVENOUS | Status: DC

## 2020-03-29 MED ORDER — ONDANSETRON HCL 4 MG/2ML IJ SOLN
INTRAMUSCULAR | Status: AC
  Filled 2020-03-29: qty 2

## 2020-03-29 MED ORDER — PHENYLEPHRINE 40 MCG/ML (10ML) SYRINGE FOR IV PUSH (FOR BLOOD PRESSURE SUPPORT)
PREFILLED_SYRINGE | INTRAVENOUS | Status: AC
  Filled 2020-03-29: qty 10

## 2020-03-29 MED ORDER — POLYETHYLENE GLYCOL 3350 17 G PO PACK: 17.0000 g | PACK | Freq: Every day | ORAL | Status: DC | PRN

## 2020-03-29 MED ORDER — METHOCARBAMOL 1000 MG/10ML IJ SOLN
500.0000 mg | Freq: Four times a day (QID) | INTRAVENOUS | Status: DC | PRN
  Administered 2020-03-30: 500 mg via INTRAVENOUS
  Filled 2020-03-29 (×2): qty 5

## 2020-03-29 MED ORDER — CEFAZOLIN SODIUM-DEXTROSE 2-4 GM/100ML-% IV SOLN
2.0000 g | INTRAVENOUS | Status: AC
  Administered 2020-03-29: 2 g via INTRAVENOUS
  Filled 2020-03-29: qty 100

## 2020-03-29 MED ORDER — SODIUM CHLORIDE 0.9 % IV SOLN: 250.0000 mL | INTRAVENOUS | Status: DC

## 2020-03-29 MED ORDER — PRAVASTATIN SODIUM 10 MG PO TABS
20.0000 mg | ORAL_TABLET | Freq: Every day | ORAL | Status: DC
  Administered 2020-03-30: 20 mg via ORAL
  Filled 2020-03-29: qty 2

## 2020-03-29 MED ORDER — THROMBIN 5000 UNITS EX SOLR
OROMUCOSAL | Status: DC | PRN
  Administered 2020-03-29: 5 mL via TOPICAL

## 2020-03-29 MED ORDER — FLEET ENEMA 7-19 GM/118ML RE ENEM: 1.0000 | ENEMA | Freq: Once | RECTAL | Status: DC | PRN

## 2020-03-29 MED ORDER — BISACODYL 10 MG RE SUPP: 10.0000 mg | Freq: Every day | RECTAL | Status: DC | PRN

## 2020-03-29 MED ORDER — ACETAMINOPHEN 650 MG RE SUPP: 650.0000 mg | RECTAL | Status: DC | PRN

## 2020-03-29 MED ORDER — METHOCARBAMOL 500 MG PO TABS
ORAL_TABLET | ORAL | Status: AC
  Filled 2020-03-29: qty 1

## 2020-03-29 MED ORDER — OXYCODONE HCL 5 MG PO TABS
10.0000 mg | ORAL_TABLET | ORAL | Status: DC | PRN
  Administered 2020-03-29 – 2020-03-30 (×5): 10 mg via ORAL
  Filled 2020-03-29 (×5): qty 2

## 2020-03-29 MED ORDER — ORAL CARE MOUTH RINSE: 15.0000 mL | Freq: Once | OROMUCOSAL | Status: AC

## 2020-03-29 MED ORDER — KCL IN DEXTROSE-NACL 20-5-0.45 MEQ/L-%-% IV SOLN
INTRAVENOUS | Status: DC
  Filled 2020-03-29: qty 1000

## 2020-03-29 MED ORDER — THROMBIN 20000 UNITS EX SOLR
CUTANEOUS | Status: DC | PRN
  Administered 2020-03-29: 20 mL via TOPICAL

## 2020-03-29 MED ORDER — MIDAZOLAM HCL 2 MG/2ML IJ SOLN
INTRAMUSCULAR | Status: AC
  Filled 2020-03-29: qty 2

## 2020-03-29 MED ORDER — TRAZODONE HCL 100 MG PO TABS
200.0000 mg | ORAL_TABLET | Freq: Every day | ORAL | Status: DC
  Administered 2020-03-29: 200 mg via ORAL
  Filled 2020-03-29: qty 2

## 2020-03-29 MED ORDER — CHLORHEXIDINE GLUCONATE CLOTH 2 % EX PADS: 6.0000 | MEDICATED_PAD | Freq: Once | CUTANEOUS | Status: DC

## 2020-03-29 MED ORDER — KETAMINE HCL 10 MG/ML IJ SOLN
INTRAMUSCULAR | Status: DC | PRN
  Administered 2020-03-29: 30 mg via INTRAVENOUS
  Administered 2020-03-29: 20 mg via INTRAVENOUS

## 2020-03-29 MED ORDER — DEXMEDETOMIDINE HCL IN NACL 200 MCG/50ML IV SOLN
INTRAVENOUS | Status: DC | PRN
Start: 2020-03-29 — End: 2020-03-29
  Administered 2020-03-29: 40 ug via INTRAVENOUS

## 2020-03-29 MED ORDER — ONDANSETRON HCL 4 MG PO TABS: 4.0000 mg | ORAL_TABLET | Freq: Four times a day (QID) | ORAL | Status: DC | PRN

## 2020-03-29 MED ORDER — ACETAMINOPHEN 10 MG/ML IV SOLN
INTRAVENOUS | Status: DC | PRN
  Administered 2020-03-29: 1000 mg via INTRAVENOUS

## 2020-03-29 MED ORDER — MIDAZOLAM HCL 5 MG/5ML IJ SOLN
INTRAMUSCULAR | Status: DC | PRN
  Administered 2020-03-29: 2 mg via INTRAVENOUS

## 2020-03-29 MED ORDER — PHENOL 1.4 % MT LIQD: 1.0000 | OROMUCOSAL | Status: DC | PRN

## 2020-03-29 MED ORDER — ONDANSETRON HCL 4 MG/2ML IJ SOLN
INTRAMUSCULAR | Status: DC | PRN
  Administered 2020-03-29: 4 mg via INTRAVENOUS

## 2020-03-29 MED ORDER — LIDOCAINE-EPINEPHRINE 1 %-1:100000 IJ SOLN
INTRAMUSCULAR | Status: DC | PRN
  Administered 2020-03-29: 5 mL

## 2020-03-29 MED ORDER — CEFAZOLIN SODIUM-DEXTROSE 2-4 GM/100ML-% IV SOLN
2.0000 g | Freq: Three times a day (TID) | INTRAVENOUS | Status: AC
  Administered 2020-03-29 – 2020-03-30 (×2): 2 g via INTRAVENOUS
  Filled 2020-03-29 (×2): qty 100

## 2020-03-29 MED ORDER — BUPIVACAINE HCL (PF) 0.5 % IJ SOLN
INTRAMUSCULAR | Status: AC
  Filled 2020-03-29: qty 30

## 2020-03-29 MED ORDER — MIDAZOLAM HCL 2 MG/2ML IJ SOLN
1.0000 mg | Freq: Once | INTRAMUSCULAR | Status: AC
  Administered 2020-03-29: 1 mg via INTRAVENOUS

## 2020-03-29 MED ORDER — FENTANYL CITRATE (PF) 250 MCG/5ML IJ SOLN
INTRAMUSCULAR | Status: AC
  Filled 2020-03-29: qty 5

## 2020-03-29 MED ORDER — ACETAMINOPHEN 10 MG/ML IV SOLN
INTRAVENOUS | Status: AC
  Filled 2020-03-29: qty 100

## 2020-03-29 MED ORDER — ROCURONIUM BROMIDE 10 MG/ML (PF) SYRINGE
PREFILLED_SYRINGE | INTRAVENOUS | Status: DC | PRN
  Administered 2020-03-29: 100 mg via INTRAVENOUS
  Administered 2020-03-29: 10 mg via INTRAVENOUS
  Administered 2020-03-29: 20 mg via INTRAVENOUS

## 2020-03-29 MED ORDER — BACLOFEN 10 MG PO TABS
10.0000 mg | ORAL_TABLET | Freq: Three times a day (TID) | ORAL | Status: DC
  Administered 2020-03-29 – 2020-03-30 (×3): 10 mg via ORAL
  Filled 2020-03-29 (×3): qty 1

## 2020-03-29 MED ORDER — METHOCARBAMOL 500 MG PO TABS
500.0000 mg | ORAL_TABLET | Freq: Four times a day (QID) | ORAL | Status: DC | PRN
  Administered 2020-03-29 – 2020-03-30 (×3): 500 mg via ORAL
  Filled 2020-03-29: qty 1

## 2020-03-29 MED ORDER — ACETAMINOPHEN 325 MG PO TABS
650.0000 mg | ORAL_TABLET | ORAL | Status: DC | PRN
  Administered 2020-03-29 – 2020-03-30 (×2): 650 mg via ORAL
  Filled 2020-03-29 (×3): qty 2

## 2020-03-29 MED ORDER — FENTANYL CITRATE (PF) 100 MCG/2ML IJ SOLN
INTRAMUSCULAR | Status: AC
  Filled 2020-03-29: qty 2

## 2020-03-29 MED ORDER — PANTOPRAZOLE SODIUM 40 MG PO TBEC
40.0000 mg | DELAYED_RELEASE_TABLET | Freq: Every day | ORAL | Status: DC
  Administered 2020-03-30: 40 mg via ORAL
  Filled 2020-03-29: qty 1

## 2020-03-29 MED ORDER — LIDOCAINE 2% (20 MG/ML) 5 ML SYRINGE
INTRAMUSCULAR | Status: AC
  Filled 2020-03-29: qty 5

## 2020-03-29 MED ORDER — ONDANSETRON HCL 4 MG/2ML IJ SOLN: 4.0000 mg | Freq: Once | INTRAMUSCULAR | Status: DC | PRN

## 2020-03-29 MED ORDER — PROPOFOL 10 MG/ML IV BOLUS
INTRAVENOUS | Status: AC
  Filled 2020-03-29: qty 40

## 2020-03-29 MED ORDER — SODIUM CHLORIDE 0.9% FLUSH
3.0000 mL | INTRAVENOUS | Status: DC | PRN
Start: 2020-03-29 — End: 2020-03-29

## 2020-03-29 MED ORDER — MORPHINE SULFATE ER 30 MG PO TBCR
30.0000 mg | EXTENDED_RELEASE_TABLET | Freq: Three times a day (TID) | ORAL | Status: DC
  Administered 2020-03-29 – 2020-03-30 (×3): 30 mg via ORAL
  Filled 2020-03-29 (×3): qty 1

## 2020-03-29 MED ORDER — PHENYLEPHRINE HCL-NACL 10-0.9 MG/250ML-% IV SOLN
INTRAVENOUS | Status: AC
  Filled 2020-03-29: qty 250

## 2020-03-29 MED ORDER — ZOLPIDEM TARTRATE 5 MG PO TABS: 5.0000 mg | ORAL_TABLET | Freq: Every evening | ORAL | Status: DC | PRN

## 2020-03-29 MED ORDER — BUPIVACAINE HCL (PF) 0.5 % IJ SOLN
INTRAMUSCULAR | Status: DC | PRN
  Administered 2020-03-29: 5 mL

## 2020-03-29 MED ORDER — 0.9 % SODIUM CHLORIDE (POUR BTL) OPTIME
TOPICAL | Status: DC | PRN
  Administered 2020-03-29: 1000 mL

## 2020-03-29 MED ORDER — DEXMEDETOMIDINE HCL IN NACL 80 MCG/20ML IV SOLN
INTRAVENOUS | Status: AC
  Filled 2020-03-29: qty 20

## 2020-03-29 MED ORDER — MENTHOL 3 MG MT LOZG: 1.0000 | LOZENGE | OROMUCOSAL | Status: DC | PRN

## 2020-03-29 MED ORDER — CHLORHEXIDINE GLUCONATE 0.12 % MT SOLN
15.0000 mL | Freq: Once | OROMUCOSAL | Status: AC
  Administered 2020-03-29: 15 mL via OROMUCOSAL
  Filled 2020-03-29: qty 15

## 2020-03-29 MED ORDER — LIDOCAINE 2% (20 MG/ML) 5 ML SYRINGE
INTRAMUSCULAR | Status: DC | PRN
  Administered 2020-03-29: 40 mg via INTRAVENOUS

## 2020-03-29 MED ORDER — KETAMINE HCL 50 MG/5ML IJ SOSY
PREFILLED_SYRINGE | INTRAMUSCULAR | Status: AC
  Filled 2020-03-29: qty 5

## 2020-03-29 MED ORDER — PROPOFOL 10 MG/ML IV BOLUS
INTRAVENOUS | Status: DC | PRN
  Administered 2020-03-29: 200 mg via INTRAVENOUS

## 2020-03-29 MED ORDER — THROMBIN 5000 UNITS EX SOLR
CUTANEOUS | Status: AC
  Filled 2020-03-29: qty 5000

## 2020-03-29 SURGICAL SUPPLY — 89 items
ADH SKN CLS APL DERMABOND .7 (GAUZE/BANDAGES/DRESSINGS) ×1
BAND INSRT 18 STRL LF DISP RB (MISCELLANEOUS) ×1
BAND RUBBER #18 3X1/16 STRL (MISCELLANEOUS) ×1 IMPLANT
BAND RUBBER #7 7IN GRN STRL LF (MISCELLANEOUS) ×1 IMPLANT
BASKET BONE COLLECTION (BASKET) ×2 IMPLANT
BLADE CLIPPER SURG (BLADE) ×1 IMPLANT
BONE CANC CHIPS 20CC PCAN1/4 (Bone Implant) ×2 IMPLANT
BUR MATCHSTICK NEURO 3.0 LAGG (BURR) ×2 IMPLANT
BUR PRECISION FLUTE 5.0 (BURR) ×2 IMPLANT
CANISTER SUCT 3000ML PPV (MISCELLANEOUS) ×2 IMPLANT
CARTRIDGE OIL MAESTRO DRILL (MISCELLANEOUS) ×1 IMPLANT
CHIPS CANC BONE 20CC PCAN1/4 (Bone Implant) ×1 IMPLANT
CNTNR URN SCR LID CUP LEK RST (MISCELLANEOUS) ×1 IMPLANT
CONT SPEC 4OZ STRL OR WHT (MISCELLANEOUS) ×2
COVER BACK TABLE 24X17X13 BIG (DRAPES) IMPLANT
COVER BACK TABLE 60X90IN (DRAPES) ×2 IMPLANT
COVER TRANSDUCER ULTRASND 9X24 (MISCELLANEOUS) ×1 IMPLANT
COVER WAND RF STERILE (DRAPES) ×1 IMPLANT
DECANTER SPIKE VIAL GLASS SM (MISCELLANEOUS) ×2 IMPLANT
DERMABOND ADVANCED (GAUZE/BANDAGES/DRESSINGS) ×1
DERMABOND ADVANCED .7 DNX12 (GAUZE/BANDAGES/DRESSINGS) ×1 IMPLANT
DIFFUSER DRILL AIR PNEUMATIC (MISCELLANEOUS) ×2 IMPLANT
DRAPE C-ARM 42X72 X-RAY (DRAPES) ×2 IMPLANT
DRAPE C-ARMOR (DRAPES) ×2 IMPLANT
DRAPE LAPAROTOMY 100X72X124 (DRAPES) ×2 IMPLANT
DRAPE SURG 17X23 STRL (DRAPES) ×2 IMPLANT
DRSG OPSITE POSTOP 4X6 (GAUZE/BANDAGES/DRESSINGS) ×1 IMPLANT
DURAPREP 26ML APPLICATOR (WOUND CARE) ×2 IMPLANT
ELECT BLADE 4.0 EZ CLEAN MEGAD (MISCELLANEOUS) ×2
ELECT REM PT RETURN 9FT ADLT (ELECTROSURGICAL) ×2
ELECTRODE BLDE 4.0 EZ CLN MEGD (MISCELLANEOUS) IMPLANT
ELECTRODE REM PT RTRN 9FT ADLT (ELECTROSURGICAL) ×1 IMPLANT
GAUZE 4X4 16PLY RFD (DISPOSABLE) IMPLANT
GAUZE SPONGE 4X4 12PLY STRL (GAUZE/BANDAGES/DRESSINGS) ×1 IMPLANT
GLOVE BIO SURGEON STRL SZ8 (GLOVE) ×5 IMPLANT
GLOVE BIOGEL PI IND STRL 6.5 (GLOVE) IMPLANT
GLOVE BIOGEL PI IND STRL 8 (GLOVE) ×2 IMPLANT
GLOVE BIOGEL PI IND STRL 8.5 (GLOVE) ×2 IMPLANT
GLOVE BIOGEL PI INDICATOR 6.5 (GLOVE) ×1
GLOVE BIOGEL PI INDICATOR 8 (GLOVE) ×6
GLOVE BIOGEL PI INDICATOR 8.5 (GLOVE) ×3
GLOVE ECLIPSE 7.5 STRL STRAW (GLOVE) ×3 IMPLANT
GLOVE ECLIPSE 8.0 STRL XLNG CF (GLOVE) ×4 IMPLANT
GLOVE EXAM NITRILE XL STR (GLOVE) IMPLANT
GLOVE SURG SS PI 7.0 STRL IVOR (GLOVE) ×2 IMPLANT
GOWN STRL REUS W/ TWL LRG LVL3 (GOWN DISPOSABLE) IMPLANT
GOWN STRL REUS W/ TWL XL LVL3 (GOWN DISPOSABLE) ×2 IMPLANT
GOWN STRL REUS W/TWL 2XL LVL3 (GOWN DISPOSABLE) ×5 IMPLANT
GOWN STRL REUS W/TWL LRG LVL3 (GOWN DISPOSABLE)
GOWN STRL REUS W/TWL XL LVL3 (GOWN DISPOSABLE) ×8
GRAFT BNE CANC CHIPS 1-8 20CC (Bone Implant) IMPLANT
HEMOSTAT POWDER KIT SURGIFOAM (HEMOSTASIS) ×1 IMPLANT
IMPL TLX20 10X11X26 20D (Cage) IMPLANT
KIT BASIN OR (CUSTOM PROCEDURE TRAY) ×2 IMPLANT
KIT INFUSE XX SMALL 0.7CC (Orthopedic Implant) ×1 IMPLANT
KIT POSITION SURG JACKSON T1 (MISCELLANEOUS) ×2 IMPLANT
KIT TURNOVER KIT B (KITS) ×2 IMPLANT
MILL MEDIUM DISP (BLADE) ×1 IMPLANT
NDL HYPO 21X1.5 SAFETY (NEEDLE) IMPLANT
NDL HYPO 25X1 1.5 SAFETY (NEEDLE) ×1 IMPLANT
NDL SPNL 18GX3.5 QUINCKE PK (NEEDLE) IMPLANT
NEEDLE HYPO 21X1.5 SAFETY (NEEDLE) ×2 IMPLANT
NEEDLE HYPO 25X1 1.5 SAFETY (NEEDLE) ×2 IMPLANT
NEEDLE SPNL 18GX3.5 QUINCKE PK (NEEDLE) IMPLANT
NS IRRIG 1000ML POUR BTL (IV SOLUTION) ×2 IMPLANT
OIL CARTRIDGE MAESTRO DRILL (MISCELLANEOUS) ×2
PACK LAMINECTOMY NEURO (CUSTOM PROCEDURE TRAY) ×2 IMPLANT
PAD ARMBOARD 7.5X6 YLW CONV (MISCELLANEOUS) ×6 IMPLANT
PATTIES SURGICAL .5 X.5 (GAUZE/BANDAGES/DRESSINGS) IMPLANT
PATTIES SURGICAL .5 X1 (DISPOSABLE) IMPLANT
PATTIES SURGICAL 1X1 (DISPOSABLE) IMPLANT
ROD RELINE-O LORD 5.5X40 (Rod) ×2 IMPLANT
SCREW LOCK RELINE 5.5 TULIP (Screw) ×4 IMPLANT
SCREW RELINE-O POLY 6.5X45 (Screw) ×4 IMPLANT
SPONGE LAP 4X18 RFD (DISPOSABLE) IMPLANT
SPONGE SURGIFOAM ABS GEL 100 (HEMOSTASIS) ×2 IMPLANT
STAPLER SKIN PROX WIDE 3.9 (STAPLE) IMPLANT
SUT VIC AB 1 CT1 18XBRD ANBCTR (SUTURE) ×2 IMPLANT
SUT VIC AB 1 CT1 8-18 (SUTURE) ×2
SUT VIC AB 2-0 CT1 18 (SUTURE) ×3 IMPLANT
SUT VIC AB 3-0 SH 8-18 (SUTURE) ×3 IMPLANT
SYR 20ML LL LF (SYRINGE) ×1 IMPLANT
SYR 3ML LL SCALE MARK (SYRINGE) ×2 IMPLANT
SYR 5ML LL (SYRINGE) IMPLANT
TLX20 IMPLANT 10X11X26 20D (Cage) ×2 IMPLANT
TOWEL GREEN STERILE (TOWEL DISPOSABLE) ×2 IMPLANT
TOWEL GREEN STERILE FF (TOWEL DISPOSABLE) ×2 IMPLANT
TRAY FOLEY MTR SLVR 16FR STAT (SET/KITS/TRAYS/PACK) ×2 IMPLANT
WATER STERILE IRR 1000ML POUR (IV SOLUTION) ×2 IMPLANT

## 2020-03-29 NOTE — Anesthesia Postprocedure Evaluation (Signed)
Anesthesia Post Note  Patient: Peter Valdez  Procedure(s) Performed: Right Lumbar four-five Transforaminal lumbar interbody fusion (Right Back)     Patient location during evaluation: PACU Anesthesia Type: General Level of consciousness: awake and alert Pain management: pain level controlled Vital Signs Assessment: post-procedure vital signs reviewed and stable Respiratory status: spontaneous breathing, nonlabored ventilation, respiratory function stable and patient connected to nasal cannula oxygen Cardiovascular status: blood pressure returned to baseline and stable Postop Assessment: no apparent nausea or vomiting Anesthetic complications: no    Last Vitals:  Vitals:   03/29/20 1208 03/29/20 1631  BP: 125/89 107/69  Pulse: 73 (!) 59  Resp: 20 19  Temp: 36.4 C 37.2 C  SpO2: 98% 96%    Last Pain:  Vitals:   03/29/20 1631  TempSrc: Oral  PainSc:                  Purity Irmen COKER

## 2020-03-29 NOTE — Brief Op Note (Signed)
03/29/2020  10:24 AM  PATIENT:  Peter Valdez  44 y.o. male  PRE-OPERATIVE DIAGNOSIS:  Foraminal stenosis of lumbar spine, lumbar disc herniation (recurrent), spondylosis, disc degeneration, radiculopathy, lumbago  L 45 level  POST-OPERATIVE DIAGNOSIS:   Foraminal stenosis of lumbar spine, lumbar disc herniation (recurrent), spondylosis, disc degeneration, radiculopathy, lumbago  L 45 level   PROCEDURE:  Procedure(s): Right Lumbar four-five Transforaminal lumbar interbody fusion (Right) with redo discectomy and pedicle screw fixation with posterolateral arthrodesis  SURGEON:  Surgeon(s) and Role:    Maeola Harman, MD - Primary    * Lisbeth Renshaw, MD - Assisting  PHYSICIAN ASSISTANT:   ASSISTANTS: Poteat, RN, Julien Girt, NP   ANESTHESIA:   general  EBL:  50 mL   BLOOD ADMINISTERED:none  DRAINS: none   LOCAL MEDICATIONS USED:  MARCAINE    and LIDOCAINE   SPECIMEN:  No Specimen  DISPOSITION OF SPECIMEN:  N/A  COUNTS:  YES  TOURNIQUET:  * No tourniquets in log *  DICTATION: Patient is 44 year old man with recurrent lumbar disc herniation L 45, right with lumbar foraminal stenosis, lumbago and severe radiculopathy. He has a severe right L5 radiculopathy. It was elected to take him to surgery for redo decompression with TLIF  fusion at this level.   Procedure: Patient was placed in a prone position on the [place]ettinger table after smooth and uncomplicated induction of general endotracheal anesthesia, utilizing LessRay. His low back was prepped and draped in usual sterile fashion with betadine scrub and DuraPrep. Area of incision was infiltrated with local lidocaine. Incision was made to the lumbodorsal fascia was incised and exposure was performed of the L4 through L5 spinous processes laminae facet joint and transverse processes. Intraoperative x-ray was obtained which confirmed correct orientation. A total hemi- laminectomy of L4 on the right was performed with disarticulation  of the facet joint at this level and thorough decompression was performed of both L4 and L5 nerve roots along with the common dural tube. This decompression was more involved than would be typical of that performed for PLIF alone and included painstaking dissection of adherent ligament compressing the thecal sac and wide decompression of all neural elements. A large free fragment of herniated disc material was removed.  A thorough discectomy was performed on the right with preparation of the endplates for grafting a trial spacer was placed this level. Bone autograft was packed within the interspace on the right along with extra extra small BM. TLX 10 x 11 x 26 mm x 20 degree tatanium cage was packed with BMP and was inserted the interspace and countersunk appropriately and expanded fully, then post-packed  with 3 cc of morselized bone autograft. The posterolateral region was extensively decorticated and pedicle probes were placed at L4 and L5 bilaterally. Intraoperative fluoroscopy confirmed correct orientationin the AP and lateral plane. 45 x 6.5 mm pedicle screws were placed at L5 bilaterally and 45 x 6.5 mm screws placed at L4 bilaterally final x-rays demonstrated well-positioned interbody grafts and pedicle screw fixation. A 40 mm lordotic rod was placed on the right and a 40 mm rod was placed on the left locked down in situ and the posterolateral region was packed with the remaining BMP and bone graft extender on the left, including into the decorticated facet joint. The deep musculature was injected with long-acting Marcaine.  Fascia was closed with 1 Vicryl sutures skin edges were reapproximated 2 and 3-0 Vicryl sutures. The wound is dressed with Dermabond and an occlusive dressing. the patient  was extubated in the operating room and taken to recovery in stable satisfactory condition. He tolerated the operation well counts were correct at the end of the case.   PLAN OF CARE: Admit to inpatient   PATIENT  DISPOSITION:  PACU - hemodynamically stable.   Delay start of Pharmacological VTE agent (>24hrs) due to surgical blood loss or risk of bleeding: yes

## 2020-03-29 NOTE — Evaluation (Signed)
Physical Therapy Evaluation Patient Details Name: LUDWIG TUGWELL MRN: [id number] DOB: [birth year] Today's Date: 03/29/2020   History of Present Illness  pt is a 44 y/o male with h/o recurrent HNP AT L 45 with right df weakness and sever pain.  Pt s/p right L45 transforaminal lumbar interbody fusion and redo discectomy with pedicles screw fix and PLA.  Clinical Impression  Pt admitted with/for lumbar fusion surgery.  He presently needs min guard assist for mobility due to weakness and pain.  Education has been initiated..  Pt currently limited functionally due to the problems listed below.  (see problems list.)  Pt will benefit from PT to maximize function and safety to be able to get home safely with available assist     Follow Up Recommendations No PT follow up    Equipment Recommendations  None recommended by PT    Recommendations for Other Services       Precautions / Restrictions Precautions Precautions: Fall;Back Precaution Booklet Issued: Yes (comment) Required Braces or Orthoses: Spinal Brace Spinal Brace: Lumbar corset;Applied in sitting position Restrictions Weight Bearing Restrictions: No      Mobility  Bed Mobility Overal bed mobility: Needs Assistance Bed Mobility: Rolling;Sidelying to Sit;Sit to Sidelying Rolling: Min guard Sidelying to sit: Min guard     Sit to sidelying: Min guard General bed mobility comments: practiced safe technique, initially needed assist , but no assist with practice.  Transfers Overall transfer level: Needs assistance   Transfers: Sit to/from Stand Sit to Stand: Min guard            Ambulation/Gait Ambulation/Gait assistance: Min guard Gait Distance (Feet): 300 Feet Assistive device: None Gait Pattern/deviations: Step-through pattern     General Gait Details: guarded, short steps.  Stairs Stairs: Yes Stairs assistance: Min guard Stair Management: One rail Right;Step to pattern;Forwards Number of Stairs:  5 General stair comments: safe with rails  Wheelchair Mobility    Modified Rankin (Stroke Patients Only)       Balance Overall balance assessment: No apparent balance deficits (not formally assessed)                                           Pertinent Vitals/Pain Pain Assessment: Faces Faces Pain Scale: Hurts even more Pain Location: incisional pain Pain Descriptors / Indicators: Grimacing;Guarding Pain Intervention(s): Monitored during session    Home Living Family/patient expects to be discharged to:: Private residence Living Arrangements: Alone Available Help at Discharge: Family;Available PRN/intermittently(as needed for 1-2 days) Type of Home: House Home Access: Stairs to enter Entrance Stairs-Rails: Doctor, general practice of Steps: several Home Layout: One level        Prior Function Level of Independence: Independent               Hand Dominance        Extremity/Trunk Assessment   Upper Extremity Assessment Upper Extremity Assessment: Overall WFL for tasks assessed    Lower Extremity Assessment Lower Extremity Assessment: Generalized weakness       Communication   Communication: No difficulties  Cognition Arousal/Alertness: Awake/alert Behavior During Therapy: WFL for tasks assessed/performed Overall Cognitive Status: Within Functional Limits for tasks assessed  General Comments General comments (skin integrity, edema, etc.): pt instucted/reinforced in back care/prec, logroll, transitions, back brace, lifting restrictions and progression of activity.    Exercises     Assessment/Plan    PT Assessment Patient needs continued PT services  PT Problem List Decreased strength;Decreased activity tolerance;Decreased mobility;Decreased knowledge of precautions;Pain       PT Treatment Interventions Gait training;Functional mobility training;Stair training;DME  instruction;Therapeutic activities;Patient/family education    PT Goals (Current goals can be found in the Care Plan section)  Acute Rehab PT Goals Patient Stated Goal: independent, manageable pain PT Goal Formulation: With patient Time For Goal Achievement: 03/31/20 Potential to Achieve Goals: Good    Frequency Min 5X/week   Barriers to discharge        Co-evaluation               AM-PAC PT "6 Clicks" Mobility  Outcome Measure Help needed turning from your back to your side while in a flat bed without using bedrails?: A Little Help needed moving from lying on your back to sitting on the side of a flat bed without using bedrails?: A Little Help needed moving to and from a bed to a chair (including a wheelchair)?: A Little Help needed standing up from a chair using your arms (e.g., wheelchair or bedside chair)?: A Little Help needed to walk in hospital room?: A Little Help needed climbing 3-5 steps with a railing? : A Little 6 Click Score: 18    End of Session   Activity Tolerance: Patient tolerated treatment well;Patient limited by pain Patient left: in bed;with call bell/phone within reach;with family/visitor present Nurse Communication: Mobility status PT Visit Diagnosis: Other abnormalities of gait and mobility (R26.89);Pain Pain - part of body: (back incision)    Time: 0981-1914 PT Time Calculation (min) (ACUTE ONLY): 20 min   Charges:   PT Evaluation $PT Eval Moderate Complexity: 1 Mod          03/29/2020  Ginger Carne., PT Acute Rehabilitation Services [phone]  (pager) [phone]  (office)  Tessie Fass Amour Trigg 03/29/2020, 3:08 PM

## 2020-03-29 NOTE — Anesthesia Procedure Notes (Signed)
Procedure Name: Intubation Date/Time: 03/29/2020 7:39 AM Performed by: Adria Dill, CRNA Pre-anesthesia Checklist: Patient identified, Emergency Drugs available, Suction available and Patient being monitored Patient Re-evaluated:Patient Re-evaluated prior to induction Oxygen Delivery Method: Circle system utilized Preoxygenation: Pre-oxygenation with 100% oxygen Induction Type: IV induction Ventilation: Mask ventilation without difficulty Laryngoscope Size: Miller and 2 Grade View: Grade I Tube type: Oral Tube size: 7.5 mm Number of attempts: 1 Airway Equipment and Method: Stylet and Oral airway Placement Confirmation: ETT inserted through vocal cords under direct vision,  positive ETCO2 and breath sounds checked- equal and bilateral Secured at: 21 cm Tube secured with: Tape Dental Injury: Teeth and Oropharynx as per pre-operative assessment

## 2020-03-29 NOTE — Transfer of Care (Signed)
Immediate Anesthesia Transfer of Care Note  Patient: Peter Valdez  Procedure(s) Performed: Right Lumbar four-five Transforaminal lumbar interbody fusion (Right Back)  Patient Location: PACU  Anesthesia Type:General  Level of Consciousness: awake and alert   Airway & Oxygen Therapy: Patient Spontanous Breathing  Post-op Assessment: Report given to RN  Post vital signs: Reviewed  Last Vitals:  Vitals Value Taken Time  BP 115/78 03/29/20 1030  Temp 36.4 C 03/29/20 1030  Pulse 77 03/29/20 1034  Resp 10 03/29/20 1034  SpO2 98 % 03/29/20 1034  Vitals shown include unvalidated device data.  Last Pain:  Vitals:   03/29/20 1030  TempSrc:   PainSc: 10-Worst pain ever         Complications: No apparent anesthesia complications

## 2020-03-29 NOTE — Progress Notes (Signed)
Awake, alert, conversant.  MAEW with good power.  Pain much improved.  No numbness. Patient is doing well.

## 2020-03-29 NOTE — Op Note (Signed)
03/29/2020  10:24 AM  PATIENT:  Benard Rink  44 y.o. male  PRE-OPERATIVE DIAGNOSIS:  Foraminal stenosis of lumbar spine, lumbar disc herniation (recurrent), spondylosis, disc degeneration, radiculopathy, lumbago  L 45 level  POST-OPERATIVE DIAGNOSIS:   Foraminal stenosis of lumbar spine, lumbar disc herniation (recurrent), spondylosis, disc degeneration, radiculopathy, lumbago  L 45 level   PROCEDURE:  Procedure(s): Right Lumbar four-five Transforaminal lumbar interbody fusion (Right) with redo discectomy and pedicle screw fixation with posterolateral arthrodesis  SURGEON:  Surgeon(s) and Role:    Maeola Harman, MD - Primary    * Lisbeth Renshaw, MD - Assisting  PHYSICIAN ASSISTANT:   ASSISTANTS: Poteat, RN, Julien Girt, NP   ANESTHESIA:   general  EBL:  50 mL   BLOOD ADMINISTERED:none  DRAINS: none   LOCAL MEDICATIONS USED:  MARCAINE    and LIDOCAINE   SPECIMEN:  No Specimen  DISPOSITION OF SPECIMEN:  N/A  COUNTS:  YES  TOURNIQUET:  * No tourniquets in log *  DICTATION: Patient is 44 year old man with recurrent lumbar disc herniation L 45, right with lumbar foraminal stenosis, lumbago and severe radiculopathy. He has a severe right L5 radiculopathy. It was elected to take him to surgery for redo decompression with TLIF  fusion at this level.   Procedure: Patient was placed in a prone position on the [place] table after smooth and uncomplicated induction of general endotracheal anesthesia, utilizing LessRay. His low back was prepped and draped in usual sterile fashion with betadine scrub and DuraPrep. Area of incision was infiltrated with local lidocaine. Incision was made to the lumbodorsal fascia was incised and exposure was performed of the L4 through L5 spinous processes laminae facet joint and transverse processes. Intraoperative x-ray was obtained which confirmed correct orientation. A total hemi- laminectomy of L4 on the right was performed with disarticulation  of the facet joint at this level and thorough decompression was performed of both L4 and L5 nerve roots along with the common dural tube. This decompression was more involved than would be typical of that performed for PLIF alone and included painstaking dissection of adherent ligament compressing the thecal sac and wide decompression of all neural elements. A large free fragment of herniated disc material was removed.  A thorough discectomy was performed on the right with preparation of the endplates for grafting a trial spacer was placed this level. Bone autograft was packed within the interspace on the right along with extra extra small BM. TLX 10 x 11 x 26 mm x 20 degree tatanium cage was packed with BMP and was inserted the interspace and countersunk appropriately and expanded fully, then post-packed  with 3 cc of morselized bone autograft. The posterolateral region was extensively decorticated and pedicle probes were placed at L4 and L5 bilaterally. Intraoperative fluoroscopy confirmed correct orientationin the AP and lateral plane. 45 x 6.5 mm pedicle screws were placed at L5 bilaterally and 45 x 6.5 mm screws placed at L4 bilaterally final x-rays demonstrated well-positioned interbody grafts and pedicle screw fixation. A 40 mm lordotic rod was placed on the right and a 40 mm rod was placed on the left locked down in situ and the posterolateral region was packed with the remaining BMP and bone graft extender on the left, including into the decorticated facet joint. The deep musculature was injected with long-acting Marcaine.  Fascia was closed with 1 Vicryl sutures skin edges were reapproximated 2 and 3-0 Vicryl sutures. The wound is dressed with Dermabond and an occlusive dressing. the patient  was extubated in the operating room and taken to recovery in stable satisfactory condition. He tolerated the operation well counts were correct at the end of the case.   PLAN OF CARE: Admit to inpatient   PATIENT  DISPOSITION:  PACU - hemodynamically stable.   Delay start of Pharmacological VTE agent (>24hrs) due to surgical blood loss or risk of bleeding: yes  

## 2020-03-29 NOTE — Interval H&P Note (Signed)
History and Physical Interval Note:  03/29/2020 7:00 AM  Peter Valdez  has presented today for surgery, with the diagnosis of Foraminal stenosis of lumbar spine.  The various methods of treatment have been discussed with the patient and family. After consideration of risks, benefits and other options for treatment, the patient has consented to  Procedure(s) with comments: Right Lumbar 4-5 Transforaminal lumbar interbody fusion (Right) - 3C as a surgical intervention.  The patient's history has been reviewed, patient examined, no change in status, stable for surgery.  I have reviewed the patient's chart and labs.  Questions were answered to the patient's satisfaction.     Dorian Heckle

## 2020-03-29 NOTE — Anesthesia Preprocedure Evaluation (Addendum)
Anesthesia Evaluation  Patient identified by MRN, date of birth, ID band Patient awake    Reviewed: Allergy & Precautions, NPO status , Patient's Chart, lab work & pertinent test results, reviewed documented beta blocker date and time   History of Anesthesia Complications Negative for: history of anesthetic complications  Airway Mallampati: I  TM Distance: <3 FB Neck ROM: Full    Dental  (+) Dental Advisory Given, Upper Dentures, Lower Dentures   Pulmonary neg pulmonary ROS, Current SmokerPatient did not abstain from smoking.,    Pulmonary exam normal        Cardiovascular negative cardio ROS Normal cardiovascular exam Rhythm:Regular Rate:Normal     Neuro/Psych PSYCHIATRIC DISORDERS Anxiety Depression negative neurological ROS     GI/Hepatic negative GI ROS, Neg liver ROS,   Endo/Other  negative endocrine ROS  Renal/GU negative Renal ROS     Musculoskeletal Back pain, right > left. Some intermittent numbness of right foot.    Abdominal Normal abdominal exam  (+)   Bowel sounds: normal.  Peds negative pediatric ROS (+)  Hematology negative hematology ROS (+)   Anesthesia Other Findings   Reproductive/Obstetrics negative OB ROS                             Anesthesia Physical Anesthesia Plan  ASA: II  Anesthesia Plan: General   Post-op Pain Management:    Induction: Intravenous  PONV Risk Score and Plan: Ondansetron and Dexamethasone  Airway Management Planned: Oral ETT  Additional Equipment:   Intra-op Plan:   Post-operative Plan: Extubation in OR  Informed Consent: I have reviewed the patients History and Physical, chart, labs and discussed the procedure including the risks, benefits and alternatives for the proposed anesthesia with the patient or authorized representative who has indicated his/her understanding and acceptance.       Plan Discussed with: CRNA and  Anesthesiologist  Anesthesia Plan Comments:         Anesthesia Quick Evaluation

## 2020-03-30 NOTE — Evaluation (Signed)
Occupational Therapy Evaluation Patient Details Name: Peter Valdez MRN: [id number] DOB: 1976-08-13 Today's Date: 03/30/2020    History of Present Illness pt is a 44 y/o male with h/o recurrent HNP AT L 45 with right df weakness and sever pain.  Pt s/p right L45 transforaminal lumbar interbody fusion and redo discectomy with pedicles screw fix and PLA.   Clinical Impression   PTA patient independent. Admitted for above and limited by back pain, precaution adherence, and decreased activity tolerance. Patient currently requires supervision for ADLs, in room mobility and functional transfers. Educated on brace mgmt and wear schedule, back precautions, ADL compensatory techniques, recommendations, safety, mobility, and DME.  He will have support of his dad and brother initially at [place], who can provided assistance as needed.  He is able to recall 3/3 back precautions at completion of session. Based on performance today, no further OT needs have been identified and pt with no further questions or concerns.  OT will sign off, thank you for this referral.     Follow Up Recommendations  No OT follow up    Equipment Recommendations  None recommended by OT    Recommendations for Other Services       Precautions / Restrictions Precautions Precautions: Fall;Back Precaution Booklet Issued: Yes (comment) Precaution Comments: Pt recalled 3/3 back precautions after education Required Braces or Orthoses: Spinal Brace Spinal Brace: Lumbar corset;Applied in sitting position Restrictions Weight Bearing Restrictions: No      Mobility Bed Mobility Overal bed mobility: Needs Assistance Bed Mobility: Rolling;Sidelying to Sit;Sit to Sidelying Rolling: Supervision Sidelying to sit: Supervision;HOB elevated     Sit to sidelying: HOB elevated;Supervision General bed mobility comments: Pt needs initial supervision for adhering to back precautions  Transfers Overall transfer level: Needs  assistance Equipment used: None Transfers: Sit to/from Stand Sit to Stand: Supervision         General transfer comment: Supervision for safety     Balance Overall balance assessment: No apparent balance deficits (not formally assessed)                                         ADL either performed or assessed with clinical judgement   ADL Overall ADL's : Needs assistance/impaired     Grooming: Standing;Supervision/safety;Cueing for safety Grooming Details (indicate cue type and reason): initial supervision for safety     Lower Body Bathing: Supervison/ safety;Cueing for back precautions;Sitting/lateral leans Lower Body Bathing Details (indicate cue type and reason): initial supervision for adhering to back precautions Upper Body Dressing : Supervision/safety Upper Body Dressing Details (indicate cue type and reason): sitting EOB, donning brace without cueing Lower Body Dressing: Supervision/safety;Sit to/from stand;Cueing for back precautions Lower Body Dressing Details (indicate cue type and reason): initial supervision for adhering to back precautions, educated on compensatory techniques for precautions  Toilet Transfer: Supervision/safety;Ambulation Toilet Transfer Details (indicate cue type and reason): initial supervision for adhering to back precautions and pain         Functional mobility during ADLs: Supervision/safety General ADL Comments: Pt needs initial supervision for adhering to back precautions. Educated on grooming at sink with precautions and using commode without bending. Pt demonstrated how he put his clothes on and OT educated on safe dressing using figure 4 in sitting for LB dressing     Vision Baseline Vision/History: No visual deficits Patient Visual Report: No change from baseline  Perception     Praxis      Pertinent Vitals/Pain Pain Assessment: Faces Faces Pain Scale: Hurts little more Pain Location: incisional  pain Pain Descriptors / Indicators: Grimacing;Operative site guarding Pain Intervention(s): Monitored during session;Repositioned     Hand Dominance Right   Extremity/Trunk Assessment Upper Extremity Assessment Upper Extremity Assessment: Overall WFL for tasks assessed   Lower Extremity Assessment Lower Extremity Assessment: Defer to PT evaluation   Cervical / Trunk Assessment Cervical / Trunk Assessment: Other exceptions Cervical / Trunk Exceptions: s/p lumbar surgery with precuations   Communication Communication Communication: No difficulties   Cognition Arousal/Alertness: Awake/alert Behavior During Therapy: WFL for tasks assessed/performed Overall Cognitive Status: Within Functional Limits for tasks assessed                                     General Comments  Pt reported pain in back and R LE. Reported his father and brother willl initially be able to assist upon dc    Exercises     Shoulder Instructions      Home Living Family/patient expects to be discharged to:: Private residence Living Arrangements: Alone Available Help at Discharge: Family;Available PRN/intermittently Type of Home: House Home Access: Stairs to enter [employer] of Steps: several Entrance Stairs-Rails: Right;Left Home Layout: One level     Bathroom Shower/Tub: Producer, television/film/video: Standard         Additional Comments: Pt reported he was going to get a new reacher upon dc      Prior Functioning/Environment Level of Independence: Independent                 OT Problem List: Decreased knowledge of precautions;Decreased safety awareness;Decreased range of motion;Pain;Decreased activity tolerance      OT Treatment/Interventions:      OT Goals(Current goals can be found in the care plan section) Acute Rehab OT Goals Patient Stated Goal: independent, manageable pain OT Goal Formulation: With patient  OT Frequency:     Barriers to  D/C:            Co-evaluation              AM-PAC OT "6 Clicks" Daily Activity     Outcome Measure Help from another person eating meals?: None Help from another person taking care of personal grooming?: A Little Help from another person toileting, which includes using toliet, bedpan, or urinal?: A Little Help from another person bathing (including washing, rinsing, drying)?: A Little Help from another person to put on and taking off regular upper body clothing?: None Help from another person to put on and taking off regular lower body clothing?: A Little 6 Click Score: 20   End of Session Equipment Utilized During Treatment: Back brace Nurse Communication: Mobility status  Activity Tolerance: Patient tolerated treatment well Patient left: in bed;with call bell/phone within reach  OT Visit Diagnosis: Pain Pain - Right/Left: Right Pain - part of body: Leg(back)                Time: 6962-9528 OT Time Calculation (min): 13 min Charges:  OT General Charges $OT Visit: 1 Visit OT Evaluation $OT Eval Low Complexity: 1 Low  Barry Brunner, OT Acute Rehabilitation Services Pager [phone] Office [phone]    Chancy Milroy 03/30/2020, 9:08 AM

## 2020-03-30 NOTE — Progress Notes (Signed)
Physical Therapy Treatment Patient Details Name: Peter Valdez MRN: [id number] DOB: Nov 23, 1975 Today's Date: 03/30/2020    History of Present Illness Pt is a 44 y/o male with h/o recurrent HNP at L4-L5 with right df weakness and sever pain.  Pt s/p right L4-L5 transforaminal lumbar interbody fusion and redo discectomy with pedicles screw fix and PLA.    PT Comments    Focus of session was on pt education. Thoroughly discussed and reviewed back precautions as they apply to mobility and ADLs, car transfers with demonstration, wear schedule for LSO, and a generalized walking program for pt to initiate upon d/c home. He is making excellent progress overall with mobility and is at a supervision level with bed mobility, transfers and ambulation. Plan is to d/c home today with family support.    Follow Up Recommendations  No PT follow up     Equipment Recommendations  None recommended by PT    Recommendations for Other Services       Precautions / Restrictions Precautions Precautions: Fall;Back Precaution Booklet Issued: Yes (comment) Precaution Comments: able to recall 3/3 back precautions Required Braces or Orthoses: Spinal Brace Spinal Brace: Lumbar corset;Applied in sitting position Restrictions Weight Bearing Restrictions: No    Mobility  Bed Mobility Overal bed mobility: Needs Assistance Bed Mobility: Sidelying to Sit Rolling: Supervision Sidelying to sit: Supervision     Sit to sidelying: HOB elevated;Supervision General bed mobility comments: increased time and effort, painful with movement  Transfers Overall transfer level: Needs assistance Equipment used: None Transfers: Sit to/from Stand Sit to Stand: Supervision         General transfer comment: Supervision for safety   Ambulation/Gait Ambulation/Gait assistance: Supervision Gait Distance (Feet): 50 Feet Assistive device: None Gait Pattern/deviations: Step-through pattern Gait velocity: WFL    General Gait Details: no instability or LOB, pt ambulating in hallway at end of session   Stairs             Wheelchair Mobility    Modified Rankin (Stroke Patients Only)       Balance Overall balance assessment: No apparent balance deficits (not formally assessed)                                          Cognition Arousal/Alertness: Awake/alert Behavior During Therapy: WFL for tasks assessed/performed Overall Cognitive Status: Within Functional Limits for tasks assessed                                        Exercises      General Comments General comments (skin integrity, edema, etc.): Pt reported pain in back and R LE. Reported his father and brother willl initially be able to assist upon dc      Pertinent Vitals/Pain Pain Assessment: Faces Faces Pain Scale: Hurts little more Pain Location: incisional pain Pain Descriptors / Indicators: Sore Pain Intervention(s): Monitored during session;Repositioned    Home Living Family/patient expects to be discharged to:: Private residence Living Arrangements: Alone Available Help at Discharge: Family;Available PRN/intermittently Type of Home: House Home Access: Stairs to enter Entrance Stairs-Rails: Right;Left Home Layout: One level   Additional Comments: Pt reported he was going to get a new reacher upon dc    Prior Function Level of Independence: Independent  PT Goals (current goals can now be found in the care plan section) Acute Rehab PT Goals Patient Stated Goal: independent, manageable pain PT Goal Formulation: With patient Time For Goal Achievement: 03/31/20 Potential to Achieve Goals: Good Progress towards PT goals: Progressing toward goals    Frequency    Min 5X/week      PT Plan Current plan remains appropriate    Co-evaluation              AM-PAC PT "6 Clicks" Mobility   Outcome Measure  Help needed turning from your back to your side  while in a flat bed without using bedrails?: None Help needed moving from lying on your back to sitting on the side of a flat bed without using bedrails?: None Help needed moving to and from a bed to a chair (including a wheelchair)?: None Help needed standing up from a chair using your arms (e.g., wheelchair or bedside chair)?: None Help needed to walk in hospital room?: None Help needed climbing 3-5 steps with a railing? : None 6 Click Score: 24    End of Session Equipment Utilized During Treatment: Back brace Activity Tolerance: Patient tolerated treatment well Patient left: Other (comment)(ambulating in hallway) Nurse Communication: Mobility status PT Visit Diagnosis: Other abnormalities of gait and mobility (R26.89);Pain Pain - part of body: (back)     Time: 7654-6503 PT Time Calculation (min) (ACUTE ONLY): 11 min  Charges:  $Self Care/Home Management: 8-22                     Anastasio Champion, DPT  Acute Rehabilitation Services Pager [phone] [place] 03/30/2020, 10:05 AM

## 2020-03-30 NOTE — Discharge Summary (Signed)
Physician Discharge Summary  Patient ID: Peter Valdez MRN: [id number] DOB/AGE: 43-17-1977 43 y.o.  Admit date: 03/29/2020 Discharge date: 03/30/2020  Admission Diagnoses:  HNP L spine  Discharge Diagnoses:  Same Active Problems:   Herniated lumbar disc without myelopathy   Discharged Condition: Stable  Hospital Course:  Peter Valdez is a 44 y.o. male who was admitted for the below procedure. There were no post operative complications. At time of discharge, pain was well controlled, ambulating with Pt/OT, tolerating po, voiding normal. Ready for discharge.  Treatments: Surgery Right Lumbar four-five Transforaminal lumbar interbody fusion (Right) with redo discectomy and pedicle screw fixation with posterolateral arthrodesis  Discharge Exam: Blood pressure 109/63, pulse 92, temperature 98 F (36.7 C), temperature source Oral, resp. rate 19, height 5\' 9"  (1.753 m), weight 74.8 kg, SpO2 99 %. Awake, alert, oriented Speech fluent, appropriate CN grossly intact MAEW Wound c/d/i  Disposition: Discharge disposition: 01-Home or Self Care       Discharge Instructions    Call MD for:  difficulty breathing, headache or visual disturbances   Complete by: As directed    Call MD for:  persistant dizziness or light-headedness   Complete by: As directed    Call MD for:  redness, tenderness, or signs of infection (pain, swelling, redness, odor or green/yellow discharge around incision site)   Complete by: As directed    Call MD for:  severe uncontrolled pain   Complete by: As directed    Call MD for:  temperature >100.4   Complete by: As directed    Diet general   Complete by: As directed    Driving Restrictions   Complete by: As directed    Do not drive until given clearance.   Increase activity slowly   Complete by: As directed    Lifting restrictions   Complete by: As directed    Do not lift anything >10lbs. Avoid bending and twisting in awkward positions. Avoid  bending at the back.   May shower / Bathe   Complete by: As directed    In 24 hours. Okay to wash wound with warm soapy water. Avoid scrubbing the wound. Pat dry.   Remove dressing in 24 hours   Complete by: As directed      Allergies as of 03/30/2020      Reactions   Mobic [meloxicam] Hives   Celebrex [celecoxib] Rash   Lyrica [pregabalin] Hives, Rash      Medication List    TAKE these medications   baclofen 10 MG tablet Commonly known as: LIORESAL Take 10 mg by mouth 3 (three) times daily.   gabapentin 400 MG capsule Commonly known as: NEURONTIN Take 800 mg by mouth 3 (three) times daily.   morphine 30 MG 12 hr tablet Commonly known as: MS CONTIN Take 30 mg by mouth every 8 (eight) hours.   omeprazole 40 MG capsule Commonly known as: PRILOSEC Take 40 mg by mouth 2 (two) times daily.   oxyCODONE-acetaminophen 10-325 MG tablet Commonly known as: PERCOCET Take 1 tablet by mouth 5 (five) times daily.   pravastatin 20 MG tablet Commonly known as: PRAVACHOL Take 20 mg by mouth daily.   traZODone 100 MG tablet Commonly known as: DESYREL Take 200 mg by mouth at bedtime.      Follow-up Information    Erline Levine, MD Follow up.   Specialty: Neurosurgery Contact information: [address] [place] [address] [phone]  SignedAlyson Ingles 03/30/2020, 7:52 AM

## 2020-03-30 NOTE — Plan of Care (Signed)
Patient alert and oriented, mae's well, voiding adequate amount of urine, swallowing without difficulty,  c/o pain at time of discharge and medication given prior to discharged. Patient discharged home with family. Script and discharged instructions given to patient. Patient and family stated understanding of instructions given. Patient has an appointment with Dr. Venetia Maxon

## 2020-07-25 ENCOUNTER — Other Ambulatory Visit: Payer: Self-pay | Admitting: Physical Medicine and Rehabilitation

## 2020-07-25 DIAGNOSIS — R51 Headache with orthostatic component, not elsewhere classified: Secondary | ICD-10-CM

## 2020-08-15 ENCOUNTER — Ambulatory Visit
Admission: RE | Admit: 2020-08-15 | Discharge: 2020-08-15 | Disposition: A | Discharge status: Home / Self Care | Payer: 59 | Source: Ambulatory Visit | Attending: Physical Medicine and Rehabilitation | Admitting: Physical Medicine and Rehabilitation

## 2020-08-15 DIAGNOSIS — R51 Headache with orthostatic component, not elsewhere classified: Secondary | ICD-10-CM

## 2020-08-15 MED ORDER — GADOBENATE DIMEGLUMINE 529 MG/ML IV SOLN
15.0000 mL | Freq: Once | INTRAVENOUS | Status: AC | PRN
  Administered 2020-08-15: 15 mL via INTRAVENOUS

## 2020-08-30 IMAGING — DX DG CHEST 2V
2 series · 2 of 2 positions shown · non-contrast
Comparison: None.

CLINICAL DATA: Left chest pain

EXAM:
CHEST - 2 VIEW

[dg chest 2 view (1 of 2)]
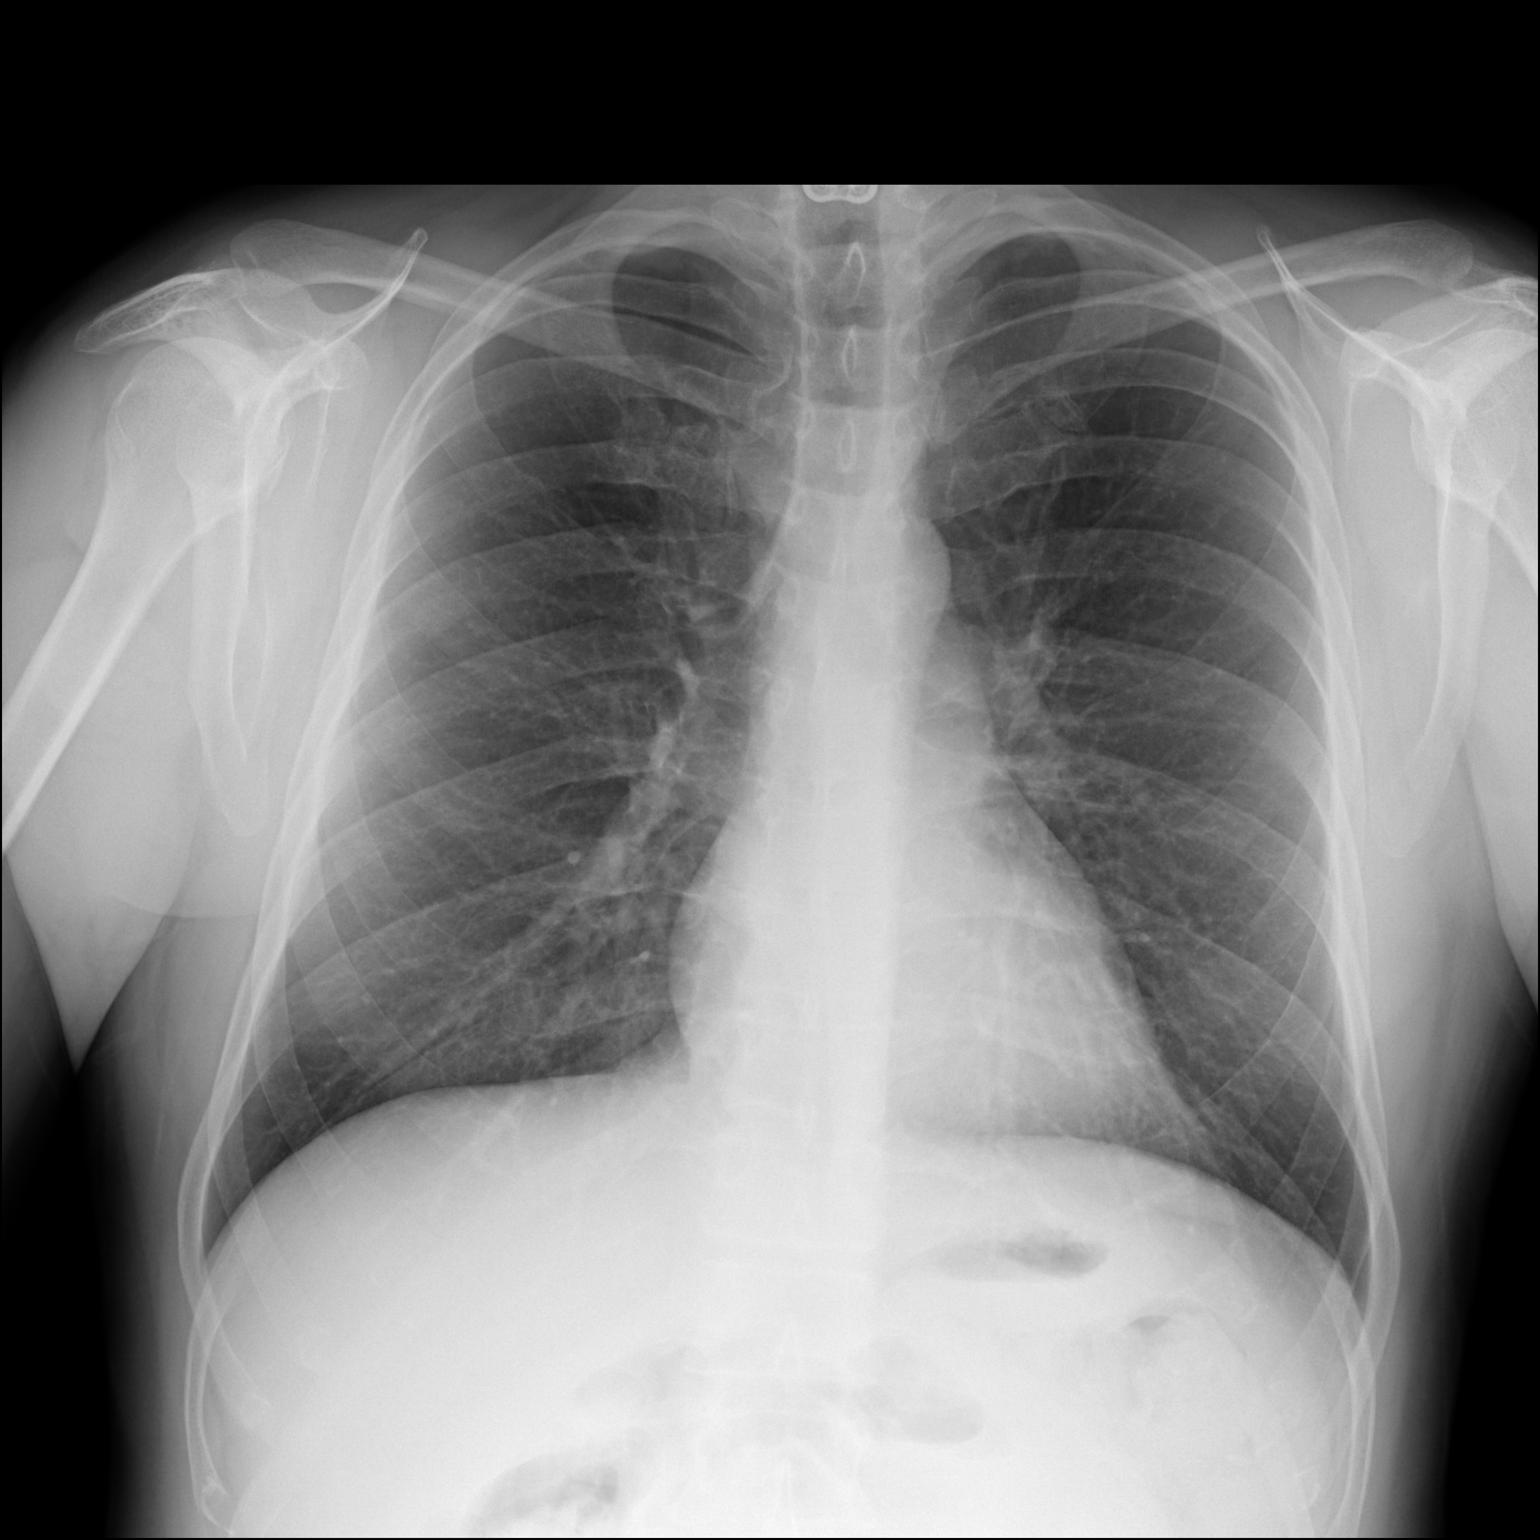

[dg chest 2 view (2 of 2)]
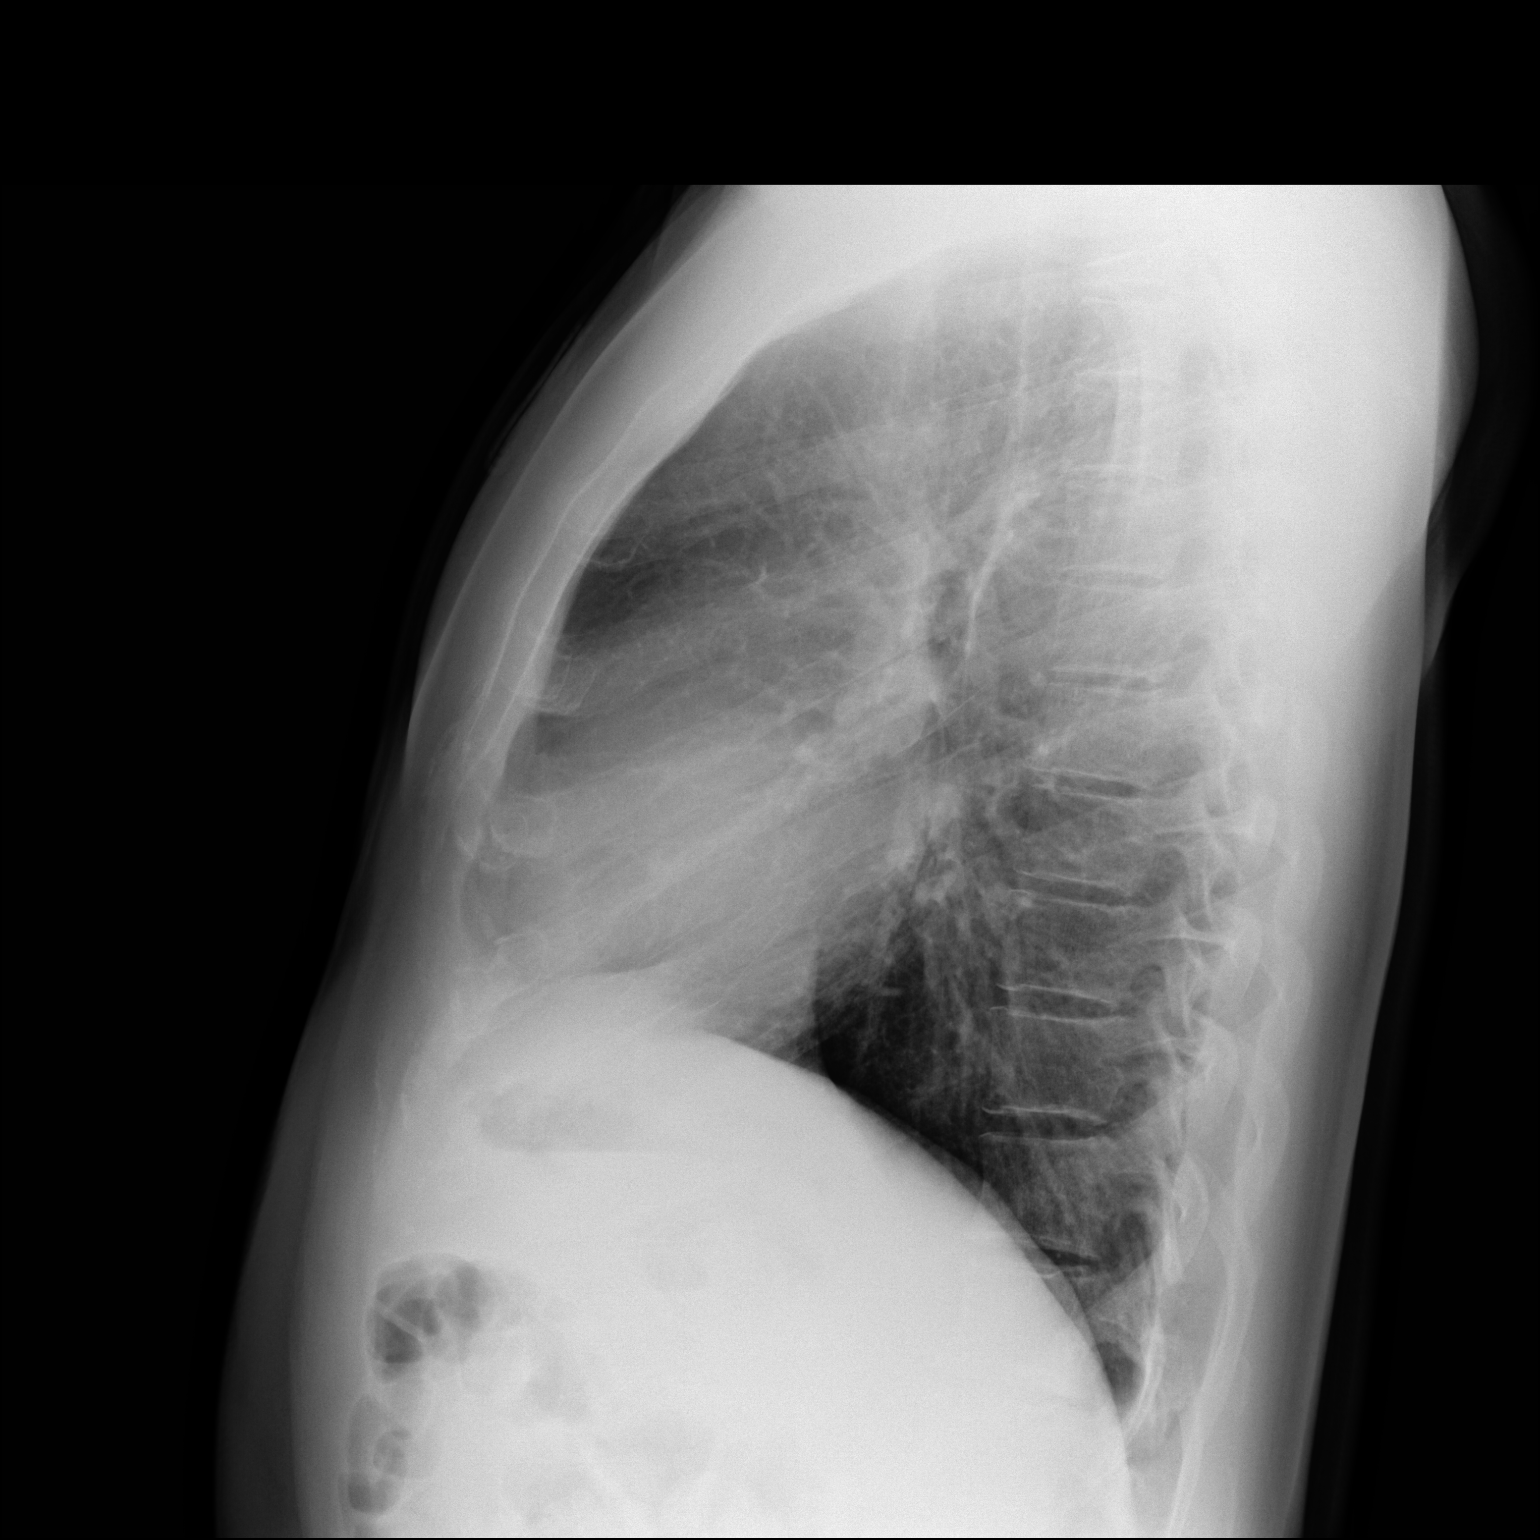

[2 of 2 positions shown; findings below may reference images not displayed]

FINDINGS: The heart size and mediastinal contours are within normal limits.
Both lungs are clear. Partially imaged cervical discectomy infusion.
IMPRESSION: No acute abnormality of the lungs.  No focal airspace opacity.

## 2021-04-05 ENCOUNTER — Other Ambulatory Visit: Payer: Self-pay

## 2021-04-05 ENCOUNTER — Emergency Department (HOSPITAL_COMMUNITY)
Admission: EM | Admit: 2021-04-05 | Discharge: 2021-04-06 | Disposition: A | Discharge status: Home / Self Care | Payer: 59 | Attending: Physician Assistant | Admitting: Physician Assistant

## 2021-04-05 DIAGNOSIS — Z5321 Procedure and treatment not carried out due to patient leaving prior to being seen by health care provider: Secondary | ICD-10-CM | POA: Insufficient documentation

## 2021-04-05 DIAGNOSIS — W230XXA Caught, crushed, jammed, or pinched between moving objects, initial encounter: Secondary | ICD-10-CM | POA: Insufficient documentation

## 2021-04-05 DIAGNOSIS — S61412A Laceration without foreign body of left hand, initial encounter: Secondary | ICD-10-CM | POA: Insufficient documentation

## 2021-04-05 DIAGNOSIS — S6721XA Crushing injury of right hand, initial encounter: Secondary | ICD-10-CM | POA: Diagnosis not present

## 2021-04-05 DIAGNOSIS — S60922A Unspecified superficial injury of left hand, initial encounter: Secondary | ICD-10-CM | POA: Diagnosis present
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
## 2021-04-05 NOTE — ED Provider Notes (Addendum)
Emergency Medicine Provider Triage Evaluation Note  MAJD TISSUE , a 45 y.o. right hand dominant male  was evaluated in triage.  Pt complains of left hand crush injury happening 30 mints PTA. Took pain meds for chronic back pain earlier tonight. Patient was moving a trailer and it slid down the hill his left hand got crushed between trailer and the bumper of a truck. He admits to tingling in the tips of his fingers. Tetanus is not UTD.  Review of Systems  Positive: Hand pain and swelling, wound Negative: Wrist pain, fever  Physical Exam  BP (!) 142/84 (BP Location: Right Arm)   Pulse 92   Temp 98.9 F (37.2 C) (Oral)   Resp 20   SpO2 99%  Gen:   Awake, no distress   Resp:  Normal effort  MSK:   Difficulty extending fingers Other:  Swelling to dorsum of left hand. Laceration to left palm with bleeding controlled with pressure dressing. Brisk cap refill. Radial pulse 2+. No tenderness in left wrist  Medical Decision Making  Medically screening exam initiated at 12:05 AM.  Appropriate orders placed.  TILFORD DEATON was informed that the remainder of the evaluation will be completed by another provider, this initial triage assessment does not replace that evaluation, and the importance of remaining in the ED until their evaluation is complete.  Patient refuses tylenol. Xray ordered.   Portions of this note were generated with Scientist, clinical (histocompatibility and immunogenetics). Dictation errors may occur despite best attempts at proofreading.    Shanon Ace, PA-C 04/06/21 0005    Shanon Ace, PA-C 04/06/21 0005    Terald Sleeper, MD 04/06/21 [phone]

## 2021-04-06 ENCOUNTER — Encounter (HOSPITAL_COMMUNITY): Payer: Self-pay | Admitting: Emergency Medicine

## 2021-04-06 ENCOUNTER — Emergency Department (HOSPITAL_COMMUNITY): Payer: 59

## 2021-04-06 ENCOUNTER — Other Ambulatory Visit: Payer: Self-pay

## 2021-04-06 NOTE — ED Notes (Signed)
Patient called for room assinment x3 with no response

## 2021-10-29 ENCOUNTER — Other Ambulatory Visit: Payer: Self-pay | Admitting: Physical Medicine and Rehabilitation

## 2021-10-29 DIAGNOSIS — M5416 Radiculopathy, lumbar region: Secondary | ICD-10-CM

## 2021-11-25 ENCOUNTER — Other Ambulatory Visit: Payer: Self-pay

## 2021-11-25 ENCOUNTER — Ambulatory Visit
Admission: RE | Admit: 2021-11-25 | Discharge: 2021-11-25 | Disposition: A | Discharge status: Home / Self Care | Payer: 59 | Source: Ambulatory Visit | Attending: Physical Medicine and Rehabilitation | Admitting: Physical Medicine and Rehabilitation

## 2021-11-25 DIAGNOSIS — M5416 Radiculopathy, lumbar region: Secondary | ICD-10-CM

## 2021-11-25 MED ORDER — GADOBENATE DIMEGLUMINE 529 MG/ML IV SOLN: 15.0000 mL | Freq: Once | INTRAVENOUS | Status: DC | PRN

## 2021-11-25 MED ORDER — GADOBENATE DIMEGLUMINE 529 MG/ML IV SOLN
15.0000 mL | Freq: Once | INTRAVENOUS | Status: AC | PRN
  Administered 2021-11-25: 15 mL via INTRAVENOUS

## 2022-10-06 ENCOUNTER — Other Ambulatory Visit (HOSPITAL_COMMUNITY): Payer: Self-pay

## 2022-10-06 MED ORDER — OXYCODONE-ACETAMINOPHEN 10-325 MG PO TABS
1.0000 | ORAL_TABLET | Freq: Every day | ORAL | 0 refills | Status: AC | PRN
  Filled 2022-10-06: qty 150, 30d supply, fill #0

## 2023-11-16 ENCOUNTER — Encounter: Payer: Self-pay | Admitting: Podiatry

## 2023-11-16 ENCOUNTER — Ambulatory Visit: Payer: 59 | Admitting: Podiatry

## 2023-11-16 DIAGNOSIS — L600 Ingrowing nail: Secondary | ICD-10-CM | POA: Diagnosis not present

## 2023-11-16 NOTE — Progress Notes (Signed)
  Subjective:  Patient ID: Peter Valdez, male    DOB: 07/13/1976,   MRN: [id number]  Chief Complaint  Patient presents with   Ingrown Toenail    Pt presents for  bil painful great  toenails and second toe on the left possible ingrown pt states that he tried to take them out himself but he thinks they are still in there.    48 y.o. male presents for concern as above. Ready to have them removed.  . Denies any other pedal complaints. Denies n/v/f/c.   Past Medical History:  Diagnosis Date   Anxiety    Depression     Objective:  Physical Exam: Vascular: DP/PT pulses 2/4 bilateral. CFT <3 seconds. Normal hair growth on digits. No edema.  Skin. No lacerations or abrasions bilateral feet. Incurvation of bilateral borders of bilateral great toes and medial borders of bilateral second toes  Musculoskeletal: MMT 5/5 bilateral lower extremities in DF, PF, Inversion and Eversion. Deceased ROM in DF of ankle joint.  Neurological: Sensation intact to light touch.   Assessment:   1. Ingrown right greater toenail   2. Ingrown left greater toenail   3. Ingrown nail of second toe of right foot   4. Ingrown nail of second toe of left foot      Plan:  Patient was evaluated and treated and all questions answered. Discussed ingrown toenails etiology and treatment options including procedure for removal vs conservative care.  Patient requesting removal of ingrown nail today. Procedure below.  Discussed procedure and post procedure care and patient expressed understanding.  Will follow-up in 2 weeks for nail check or sooner if any problems arise.    Procedure:  Procedure: partial Nail Avulsion of bilaterally hallux bilateral nail border. Bilateral second digit medial nail border Surgeon: Asberry Failing, DPM  Pre-op Dx: Ingrown toenail without infection Post-op: Same  Place of Surgery: Office exam room.  Indications for surgery: Painful and ingrown toenail.    The patient is requesting  removal of nail with  chemical matrixectomy. Risks and complications were discussed with the patient for which they understand and written consent was obtained. Under sterile conditions a total of 3 mL of  1% lidocaine  plain was infiltrated in a hallux block fashion. Once anesthetized, the skin was prepped in sterile fashion. A tourniquet was then applied. Next the bilateral aspect of hallux nail border and bilateral medial border of second digits were then sharply excised making sure to remove the entire offending nail border.  Next phenol was then applied under standard conditions to permanently destroy the matrix and copiously irrigated. Silvadene was applied. A dry sterile dressing was applied. After application of the dressing the tourniquet was removed and there is found to be an immediate capillary refill time to the digit. The patient tolerated the procedure well without any complications. Post procedure instructions were discussed the patient for which he verbally understood. Follow-up in two weeks for nail check or sooner if any problems are to arise. Discussed signs/symptoms of infection and directed to call the office immediately should any occur or go directly to the emergency room. In the meantime, encouraged to call the office with any questions, concerns, changes symptoms.   Asberry Failing, DPM

## 2023-11-16 NOTE — Patient Instructions (Signed)

## 2023-11-30 ENCOUNTER — Ambulatory Visit: Payer: 59 | Admitting: Podiatry

## 2024-06-07 ENCOUNTER — Other Ambulatory Visit (HOSPITAL_COMMUNITY): Payer: Self-pay

## 2024-06-07 MED ORDER — MORPHINE SULFATE ER 30 MG PO TBCR
30.0000 mg | EXTENDED_RELEASE_TABLET | Freq: Three times a day (TID) | ORAL | 0 refills | Status: AC
  Filled 2024-06-07: qty 90, 30d supply, fill #0

## 2024-06-08 ENCOUNTER — Other Ambulatory Visit (HOSPITAL_COMMUNITY): Payer: Self-pay

## 2024-11-06 ENCOUNTER — Other Ambulatory Visit (HOSPITAL_COMMUNITY): Payer: Self-pay

## 2024-11-06 MED ORDER — OXYCODONE-ACETAMINOPHEN 10-325 MG PO TABS
1.0000 | ORAL_TABLET | Freq: Every day | ORAL | 0 refills | Status: AC | PRN
  Filled 2024-11-06: qty 150, 30d supply, fill #0

## 2024-11-06 MED ORDER — MORPHINE SULFATE ER 30 MG PO TBCR
30.0000 mg | EXTENDED_RELEASE_TABLET | Freq: Three times a day (TID) | ORAL | 0 refills | Status: DC
  Filled 2024-11-06: qty 90, 30d supply, fill #0

## 2024-11-20 ENCOUNTER — Other Ambulatory Visit: Payer: Self-pay

## 2024-12-03 ENCOUNTER — Other Ambulatory Visit: Payer: Self-pay

## 2024-12-07 ENCOUNTER — Other Ambulatory Visit (HOSPITAL_COMMUNITY): Payer: Self-pay

## 2024-12-07 MED ORDER — OXYCODONE-ACETAMINOPHEN 10-325 MG PO TABS
1.0000 | ORAL_TABLET | Freq: Every day | ORAL | 0 refills | Status: AC
  Filled 2024-12-07: qty 150, 30d supply, fill #0

## 2024-12-07 MED ORDER — MORPHINE SULFATE ER 30 MG PO TBCR
30.0000 mg | EXTENDED_RELEASE_TABLET | Freq: Three times a day (TID) | ORAL | 0 refills | Status: AC
  Filled 2024-12-07: qty 90, 30d supply, fill #0
